# Patient Record
Sex: Male | Born: 1960 | Race: White | Hispanic: No | Marital: Single | Smoking: Never smoker
Health system: Southern US, Academic
[De-identification: ages and names within clinical notes are randomized; demographics above are authoritative.]

## PROBLEM LIST (undated history)

## (undated) DIAGNOSIS — I4891 Unspecified atrial fibrillation: Secondary | ICD-10-CM

## (undated) DIAGNOSIS — E785 Hyperlipidemia, unspecified: Secondary | ICD-10-CM

## (undated) DIAGNOSIS — R55 Syncope and collapse: Secondary | ICD-10-CM

## (undated) DIAGNOSIS — K219 Gastro-esophageal reflux disease without esophagitis: Secondary | ICD-10-CM

## (undated) DIAGNOSIS — G4733 Obstructive sleep apnea (adult) (pediatric): Secondary | ICD-10-CM

## (undated) DIAGNOSIS — C801 Malignant (primary) neoplasm, unspecified: Secondary | ICD-10-CM

## (undated) DIAGNOSIS — B019 Varicella without complication: Secondary | ICD-10-CM

## (undated) DIAGNOSIS — E7800 Pure hypercholesterolemia, unspecified: Secondary | ICD-10-CM

## (undated) DIAGNOSIS — C439 Malignant melanoma of skin, unspecified: Secondary | ICD-10-CM

## (undated) HISTORY — DX: Malignant melanoma of skin, unspecified (CMS HCC): C43.9

## (undated) HISTORY — DX: Gastro-esophageal reflux disease without esophagitis: K21.9

## (undated) HISTORY — DX: Unspecified atrial fibrillation (CMS HCC): I48.91

## (undated) HISTORY — DX: Malignant (primary) neoplasm, unspecified (CMS HCC): C80.1

## (undated) HISTORY — DX: Pure hypercholesterolemia, unspecified: E78.00

## (undated) HISTORY — PX: HX TONSILLECTOMY: SHX27

## (undated) HISTORY — PX: HX OTHER: 2100001105

## (undated) HISTORY — PX: HX WISDOM TEETH EXTRACTION: SHX21

## (undated) HISTORY — DX: Varicella without complication: B01.9

## (undated) HISTORY — PX: COLONOSCOPY: WVUENDOPRO10

## (undated) HISTORY — DX: Hyperlipidemia, unspecified: E78.5

## (undated) HISTORY — DX: Unspecified atrial fibrillation: I48.91

## (undated) HISTORY — DX: Obstructive sleep apnea (adult) (pediatric): G47.33

## (undated) HISTORY — DX: Syncope and collapse: R55

## (undated) HISTORY — PX: TONSILLECTOMY: SUR1361

---

## 1998-11-24 ENCOUNTER — Ambulatory Visit (HOSPITAL_COMMUNITY): Payer: Self-pay | Admitting: Internal Medicine

## 1999-04-03 ENCOUNTER — Emergency Department (HOSPITAL_COMMUNITY): Admission: EM | Admit: 1999-04-03 | Discharge: 1999-04-03 | Payer: Self-pay | Admitting: Emergency Medicine

## 1999-04-04 ENCOUNTER — Encounter: Payer: Self-pay | Admitting: Emergency Medicine

## 2000-07-26 ENCOUNTER — Encounter: Payer: Self-pay | Admitting: Gastroenterology

## 2000-07-26 ENCOUNTER — Ambulatory Visit (HOSPITAL_COMMUNITY): Admission: RE | Admit: 2000-07-26 | Discharge: 2000-07-26 | Payer: Self-pay | Admitting: Gastroenterology

## 2000-08-12 ENCOUNTER — Encounter (INDEPENDENT_AMBULATORY_CARE_PROVIDER_SITE_OTHER): Payer: Self-pay

## 2000-08-12 ENCOUNTER — Ambulatory Visit (HOSPITAL_COMMUNITY): Admission: RE | Admit: 2000-08-12 | Discharge: 2000-08-12 | Payer: Self-pay | Admitting: Gastroenterology

## 2000-11-05 ENCOUNTER — Emergency Department (HOSPITAL_COMMUNITY): Admission: EM | Admit: 2000-11-05 | Discharge: 2000-11-05 | Payer: Self-pay | Admitting: *Deleted

## 2003-05-02 ENCOUNTER — Inpatient Hospital Stay (HOSPITAL_COMMUNITY): Admission: AD | Admit: 2003-05-02 | Discharge: 2003-05-03 | Payer: Self-pay | Admitting: Cardiology

## 2003-08-17 ENCOUNTER — Encounter: Admission: RE | Admit: 2003-08-17 | Discharge: 2003-08-17 | Payer: Self-pay | Admitting: Family Medicine

## 2004-07-05 ENCOUNTER — Ambulatory Visit: Payer: Self-pay | Admitting: Cardiology

## 2005-07-31 ENCOUNTER — Ambulatory Visit: Payer: Self-pay | Admitting: Internal Medicine

## 2006-01-14 ENCOUNTER — Ambulatory Visit: Payer: Self-pay | Admitting: Cardiology

## 2007-02-10 ENCOUNTER — Ambulatory Visit: Payer: Self-pay | Admitting: Cardiology

## 2008-09-29 ENCOUNTER — Telehealth: Payer: Self-pay | Admitting: Cardiology

## 2008-11-25 ENCOUNTER — Telehealth: Payer: Self-pay | Admitting: Cardiology

## 2008-12-21 DIAGNOSIS — R55 Syncope and collapse: Secondary | ICD-10-CM | POA: Insufficient documentation

## 2008-12-22 DIAGNOSIS — K219 Gastro-esophageal reflux disease without esophagitis: Secondary | ICD-10-CM | POA: Insufficient documentation

## 2008-12-22 DIAGNOSIS — R7309 Other abnormal glucose: Secondary | ICD-10-CM | POA: Insufficient documentation

## 2008-12-22 DIAGNOSIS — I4891 Unspecified atrial fibrillation: Secondary | ICD-10-CM | POA: Insufficient documentation

## 2008-12-22 DIAGNOSIS — E785 Hyperlipidemia, unspecified: Secondary | ICD-10-CM | POA: Insufficient documentation

## 2009-10-08 ENCOUNTER — Encounter: Payer: Self-pay | Admitting: Cardiology

## 2009-10-08 ENCOUNTER — Emergency Department (HOSPITAL_COMMUNITY): Admission: EM | Admit: 2009-10-08 | Discharge: 2009-10-08 | Payer: Self-pay | Admitting: Emergency Medicine

## 2010-02-26 ENCOUNTER — Telehealth: Payer: Self-pay | Admitting: Cardiology

## 2010-03-06 ENCOUNTER — Encounter: Payer: Self-pay | Admitting: Cardiology

## 2010-03-08 ENCOUNTER — Ambulatory Visit
Admission: RE | Admit: 2010-03-08 | Discharge: 2010-03-08 | Payer: Self-pay | Source: Home / Self Care | Admitting: Orthopedic Surgery

## 2010-03-20 ENCOUNTER — Ambulatory Visit: Payer: Self-pay | Admitting: Cardiology

## 2010-03-30 ENCOUNTER — Ambulatory Visit: Payer: Self-pay | Admitting: Cardiology

## 2010-07-19 NOTE — Assessment & Plan Note (Signed)
Summary: rov/jss   History of Present Illness: [name] is a  gentleman, who has a history of paroxysmal atrial fibrillation.  He has not been seen since August of 2008. Note he had a prolonged vagal episode following an exercise treadmill requiring transient CPR. Since he was last seen he denies dyspnea on exertion, orthopnea, PND, pedal edema, palpitations or syncope. He has had no chest pain. He's had no bouts of atrial fibrillation but felt like on 2 occasions he may be close to going into this rhythm.  Current Medications (verified): 1)  Lipitor 40 Mg Tabs (Atorvastatin Calcium) .Marland Kitchen.. 1 Tab By Mouth Once Daily 2)  Metoprolol Succinate 25 Mg Xr24h-Tab (Metoprolol Succinate) .Marland Kitchen.. 1 Tab By Mouth Once Daily 3)  Aspirin 81 Mg Tbec (Aspirin) .... Take One Tablet By Mouth Daily 4)  Prevacid 30 Mg Cpdr (Lansoprazole) .Marland Kitchen.. 1 Tab By Mouth Once Daily 5)  Multivitamins   Tabs (Multiple Vitamin) .Marland Kitchen.. 1 Tab By Mouth Once Daily  Allergies: 1)  ! Pcn 2)  ! * Clindamycin  Past History:  Past Medical History: HYPERLIPIDEMIA (ICD-272.4) PAROXYSMAL ATRIAL FIBRILLATION (ICD-427.31) SYNCOPE AND COLLAPSE (ICD-780.2) GERD (ICD-530.81) vagal episode  Past Surgical History: Reviewed history from 12/21/2008 and no changes required. tonsillectomy.  Family History: Reviewed history from 12/21/2008 and no changes required. Family History of Coronary Artery Disease (father with MI at age 66)  Social History: Reviewed history from 12/21/2008 and no changes required.  He does not smoke.  He does occasionally consume alcohol,  but has decreased this since his atrial fibrillation began. Full Time Single   Review of Systems       no fevers or chills, productive cough, hemoptysis, dysphasia, odynophagia, melena, hematochezia, dysuria, hematuria, rash, seizure activity, orthopnea, PND, pedal edema, claudication. Remaining systems are negative.   Vital Signs:  Patient profile:   50 year old  male Height:      70 inches Weight:      201 pounds BMI:     28.94 Pulse rate:   70 / minute Resp:     12 per minute BP sitting:   118 / 70  (left arm)  Vitals Entered By: Kem Parkinson (March 20, 2010 3:38 PM)  Physical Exam  General:  Well developed/well nourished in NAD Skin warm/dry Patient not depressed No peripheral clubbing Back-normal HEENT-normal/normal eyelids Neck supple/normal carotid upstroke bilaterally; no bruits; no JVD; no thyromegaly chest - CTA/ normal expansion CV - RRR/normal S1 and S2; no murmurs, rubs or gallops;  PMI nondisplaced Abdomen -NT/ND, no HSM, no mass, + bowel sounds, no bruit 2+ femoral pulses, no bruits Ext-no edema, chords, 2+ DP Neuro-grossly nonfocal     EKG  Procedure date:  03/06/2010  Findings:      Sinus rhythm at a rate of 53. Left ventricle hypertrophy. No ST changes.  Impression & Recommendations:  Problem # 1:  PAROXYSMAL ATRIAL FIBRILLATION (ICD-427.31)  No recurrent episodes off of flecanide. Continue beta blocker. Continue aspirin. No embolic risk factors. The following medications were removed from the medication list:    Flecainide Acetate 50 Mg Tabs (Flecainide acetate) His updated medication list for this problem includes:    Metoprolol Succinate 25 Mg Xr24h-tab (Metoprolol succinate) .Marland Kitchen... 1 tab by mouth once daily    Aspirin 81 Mg Tbec (Aspirin) .Marland Kitchen... Take one tablet by mouth daily  The following medications were removed from the medication list:    Flecainide Acetate 50 Mg Tabs (Flecainide acetate) His updated medication list for this  problem includes:    Metoprolol Succinate 25 Mg Xr24h-tab (Metoprolol succinate) .Marland Kitchen... 1 tab by mouth once daily    Aspirin 81 Mg Tbec (Aspirin) .Marland Kitchen... Take one tablet by mouth daily  Problem # 2:  HYPERLIPIDEMIA (ICD-272.4)  Continue statin. Check lipids and liver. I have explained the importance of close followup. The following medications were removed from the  medication list:    Lipitor 40 Mg Tabs (Atorvastatin calcium) His updated medication list for this problem includes:    Lipitor 40 Mg Tabs (Atorvastatin calcium) .Marland Kitchen... 1 tab by mouth once daily  The following medications were removed from the medication list:    Lipitor 40 Mg Tabs (Atorvastatin calcium) His updated medication list for this problem includes:    Lipitor 40 Mg Tabs (Atorvastatin calcium) .Marland Kitchen... 1 tab by mouth once daily  Problem # 3:  SYNCOPE AND COLLAPSE (ICD-780.2) Occurred previously following his treadmill and felt to be vagal in etiology. The following medications were removed from the medication list:    Flecainide Acetate 50 Mg Tabs (Flecainide acetate) His updated medication list for this problem includes:    Metoprolol Succinate 25 Mg Xr24h-tab (Metoprolol succinate) .Marland Kitchen... 1 tab by mouth once daily    Aspirin 81 Mg Tbec (Aspirin) .Marland Kitchen... Take one tablet by mouth daily  Problem # 4:  GERD (ICD-530.81)  His updated medication list for this problem includes:    Prevacid 30 Mg Cpdr (Lansoprazole) .Marland Kitchen... 1 tab by mouth once daily  His updated medication list for this problem includes:    Prevacid 30 Mg Cpdr (Lansoprazole) .Marland Kitchen... 1 tab by mouth once daily  Patient Instructions: 1)  Your physician recommends that you schedule a follow-up appointment in: YEAR WITH DR  CRENSHAW 2)  Your physician recommends that you return for lab work ZO:XWRUE LIVER FASTING 03/22/10 AT 8:30 3)  Your physician recommends that you continue on your current medications as directed. Please refer to the Current Medication list given to you today. Prescriptions: LIPITOR 40 MG TABS (ATORVASTATIN CALCIUM) 1 tab by mouth once daily  #90 x 3   Entered by:   Scherrie Bateman, LPN   Authorized by:   Ferman Hamming, MD, [facility]   Signed by:   Scherrie Bateman, LPN on 45/40/9811   Method used:   [facility]  [address] [id number]       [address]       [place], [place]  [address]       Ph: [phone] or [phone]       Fax: [phone]   RxID:   [id number] METOPROLOL SUCCINATE 25 MG XR24H-TAB (METOPROLOL SUCCINATE) 1 tab by mouth once daily  #90 x 3   Entered by:   Scherrie Bateman, LPN   Authorized by:   Ferman Hamming, MD, [facility]   Signed by:   Scherrie Bateman, LPN on 38/75/6433   Method used:   [facility]  [address] [id number]       [address]       [place], [place]  [address]       Ph: [phone] or [phone]       Fax: [phone]   RxID:   [id number]

## 2010-07-19 NOTE — Letter (Signed)
Summary: [facility] Physician Documentation Sheet  [facility] Physician Documentation Sheet   Imported By: Earl Many 03/16/2010 15:24:00  _____________________________________________________________________  External Attachment:    Type:   Image     Comment:   External Document

## 2010-07-19 NOTE — Progress Notes (Signed)
Summary: refill  Phone Note Refill Request Message from:  Patient on February 26, 2010 1:06 PM  Refills Requested: Medication #1:  METOPROLOL SUCCINATE 25 MG XR24H-TAB 1 tab by mouth once [id number]  Initial call taken by: Judie Grieve,  February 26, 2010 1:07 PM  Follow-up for Phone Call        Per Dr. Jens Som  can only give enough to last till next appt...will not be able to give pt more if he does not show up.. Called pt let him no he must come to next appt Follow-up by: Kem Parkinson,  February 26, 2010 1:42 PM    Prescriptions: METOPROLOL SUCCINATE 25 MG XR24H-TAB (METOPROLOL SUCCINATE) 1 tab by mouth once daily  #30 Tablet x 1   Entered by:   Kem Parkinson   Authorized by:   Ferman Hamming, MD, [facility]   Signed by:   Kem Parkinson on 02/26/2010   Method used:   [facility]  [address] [id number]       [address]       [place], [place]  [address]       Ph: [phone] or [phone]       Fax: [phone]   RxID:   [id number]

## 2010-08-30 LAB — POCT I-STAT, CHEM 8
BUN: 14 mg/dL (ref 6–23)
Calcium, Ion: 1.2 mmol/L (ref 1.12–1.32)
Chloride: 106 mEq/L (ref 96–112)
HCT: 42 % (ref 39.0–52.0)
Potassium: 3.7 mEq/L (ref 3.5–5.1)
Sodium: 140 mEq/L (ref 135–145)

## 2010-08-30 LAB — POCT HEMOGLOBIN-HEMACUE: Hemoglobin: 14.7 g/dL (ref 13.0–17.0)

## 2010-10-30 NOTE — Assessment & Plan Note (Signed)
[facility]                            CARDIOLOGY OFFICE NOTE   NAME:WOOFTERElster, Corbello                       MRN:          [id number]  DATE:02/10/2007                            DOB:          Nov 03, 1960    Mr. Catala is a 50 year old gentleman, who has a history of paroxysmal  atrial fibrillation and is on flecainide therapy.  Since I last saw him,  he denies any dyspnea on exertion, orthopnea, PND, pedal edema,  palpitations, pre-syncope, syncope or exertional chest pain.  He has had  back pain from a recent injury.   MEDICATIONS:  His medications include:  1. Aspirin 81 mg p.o. q. day.  2. Toprol 25 mg p.o. b.i.d.  3. Prevacid 30 mg p.o. q. day.  4. Lipitor 40 mg p.o. q. day.  5. Flecainide 50 mg, and he has only taking this once a day.   PHYSICAL EXAMINATION:  VITAL SIGNS:  Today, shows a blood pressure of  115/74, and his pulse is 55.  He weighs 197 pounds.  HEENT:  Normal.  NECK:  Supple.  CHEST:  Clear.  CARDIOVASCULAR:  Regular rate and rhythm.  ABDOMEN:  Benign.  EXTREMITIES:  Show no edema.   Electrocardiogram shows a sinus rhythm at a rate of 53.  There are no ST  changes noted.   DIAGNOSES:  1. Paroxysmal atrial fibrillation - the patient has had no      palpitations and is in sinus rhythm today.  He has only taken his      flecainide once daily.  Most of his previous episodes occurred      following episodes of significant alcohol intake.  He does not do      this anymore.  We will therefore discontinue his flecainide and      follow him expectantly.  If he develops recurrent arrhythmias in      the future, then we could resume.  I will continue on his Toprol,      as well as his aspirin (he has no embolic risk factors).  2. Hyperlipidemia - He is on Lipitor, and we will make sure that he      has had recent lipids and liver drawn.  3. History of vagal episode, after termination of exercise treadmill.   I will see him back in  12 months.     Madolyn Frieze Jens Som, MD, [facility]  Electronically Signed   BSC/MedQ  DD: 02/10/2007  DT: 02/10/2007  Job #: [id number]

## 2010-11-02 NOTE — H&P (Signed)
NAME:  Colton Moore, BARIS NO.:  [id number]   MEDICAL RECORD NO.:  [id number]                   PATIENT TYPE:  INP   LOCATION:                                       FACILITY:  [facility]   PHYSICIAN:  Olga Millers, M.D.                DATE OF BIRTH:  1961-04-07   DATE OF ADMISSION:  05/02/2003  DATE OF DISCHARGE:                                HISTORY & PHYSICAL   The patient is a 50 year old gentleman with a past medical history of  paroxysmal atrial fibrillation and hyperlipidemia who was admitted with a  syncopal episode.  The patient has a long history of paroxysmal atrial  fibrillation.  He did have stress test performed in June of 2004 which  showed an ejection fraction of 50%, but no ischemia or infarction.  His most  recent echocardiogram was in July of 2004 and showed normal LV function,  mild right atrial enlargement, trace aortic insufficiency, and trace mitral  regurgitation and tricuspid regurgitation.  The patient has had recent  episodes of atrial fibrillation that have been symptomatic.  I saw him on  Friday of last week on November 11.  He had developed palpitations the night  before and was in atrial fibrillation at the time of evaluation.  His rate  was controlled.  He does complain of fatigue with these episodes but there  was no shortness of breath, chest pain, or syncope.  Because of the  symptomatic nature of his atrial fibrillation we placed the patient on  flecainide 50 mg p.o. b.i.d.  He states that he converted Saturday morning.  He otherwise has not had syncope, chest pain, or shortness of breath.  He  presented to the office today to have his treadmill to exclude ventricular  arrhythmias while exercising.  He did well on the treadmill and exercised  for 12 minutes and 15 seconds.  His heart rate at rest was 45-52.  It  increased to a maximum of 151.  There were no ST changes and there was a  rare PVC noted.  However, after  completing exercise the patient became  suddenly dizzy and his heart rate decreased and subsequently went asystolic.  He did receive chest compressions for approximately two seconds while he was  in the treadmill room.  He then had return of his rhythm.  He was  diaphoretic but he denied any chest pain, shortness of breath, or other  complaints.  We felt this was most likely a vagal reaction.  He is being  admitted for observation.   CURRENT MEDICATIONS:  1. Aspirin 81 mg p.o. daily.  2. Zocor 40 mg p.o. q.h.s.  3. Toprol 25 mg p.o. daily.  4. Flecainide 50 mg p.o. b.i.d.   ALLERGIES:  PENICILLIN, CLINDAMYCIN.   SOCIAL HISTORY:  He does not smoke.  He does occasionally consume alcohol,  but has decreased this since his  atrial fibrillation began.   FAMILY HISTORY:  Positive for coronary artery disease.   PAST MEDICAL HISTORY:  Significant for hyperlipidemia but there is no  diabetes mellitus or hypertension.  He does have a history of paroxysmal  atrial fibrillation and gastroesophageal reflux disease.   PAST SURGICAL HISTORY:  He has had a prior tonsillectomy.   REVIEW OF SYSTEMS:  There is no headaches, fevers, or chills.  There is no  productive cough or hemoptysis.  There is no dysphagia, odynophagia, melena,  or hematochezia.  There is no dysuria or hematuria.  There is no rash or  seizure activity.  There is no orthopnea, PND, or pedal edema.  He has had  no prior syncopal episodes.  Remaining systems are negative.   PHYSICAL EXAMINATION:  VITAL SIGNS:  Blood pressure 112/57, pulse now is 80.  GENERAL:  He is well-developed and well-nourished.  He is mildly  diaphoretic.  SKIN:  Otherwise unremarkable.  He does not appear to be depressed and there  is no peripheral clubbing.  HEENT:  Unremarkable with normal eyelids.  NECK:  Supple with a normal upstroke bilaterally.  There are no bruits  noted.  There is no jugular venous distention or thyromegaly noted.  CHEST:  Clear  to auscultation.  Normal expansion.  CARDIOVASCULAR:  Regular rate and rhythm with normal S1, S2.  There are no  murmurs, rubs, or gallops noted.  ABDOMEN:  No tenderness seen.  Positive bowel sounds.  No  hepatosplenomegaly.  No mass appreciated.  There is no abdominal bruit.  He  has 2+ femoral pulses bilaterally and no bruits.  EXTREMITIES:  No edema.  I can palpate no cords.  He has 2+ dorsalis pedis  pulses bilaterally.  NEUROLOGIC:  Grossly intact.   LABORATORIES:  His baseline echocardiogram showed a sinus bradycardia.  There were no ST changes and his intervals were normal.   DIAGNOSES:  1. Syncopal episode most likely vagal in etiology following exercise     treadmill test.  2. Paroxysmal atrial fibrillation.  3. Flecainide therapy.  4. Hyperlipidemia.  5. Gastroesophageal reflux disease.   PLAN:  The patient presented for his exercise treadmill today to exclude  ventricular arrhythmias following initiation of his flecainide.  His  exercise treadmill went very well with normal heart rate response as well as  blood pressure response.  There was also no chest pain or ST changes.  Following termination he appeared to vagal with decreased heart rate leading  to eventual asystole.  He did receive compressions for one to two seconds.  He is now asymptomatic.  I have elected to admit him to telemetry for  observation.  We will continue with his present medications.  I did review  the strips with Doylene Canning. Ladona Ridgel, M.D. and he agreed that this is most likely  a vagal response and no other work-up is necessary as long as his enzymes  are negative and he has no further arrhythmias.  We will continue with his  Toprol and flecainide at this point.  He does not have embolic risk factors  and we will therefore continue aspirin and no Coumadin.                                                Olga Millers, M.D.   BC/MEDQ  D:  05/02/2003  T:  05/02/2003  Job:  [id number]

## 2010-11-02 NOTE — Discharge Summary (Signed)
NAME:  Colton Moore, Colton Moore                          ACCOUNT NO.:  [id number]   MEDICAL RECORD NO.:  [id number]                   PATIENT TYPE:  INP   LOCATION:  4736                                 FACILITY:  [facility]   PHYSICIAN:  Olga Millers, M.D.                DATE OF BIRTH:  06-15-1961   DATE OF ADMISSION:  05/02/2003  DATE OF DISCHARGE:  05/03/2003                                 DISCHARGE SUMMARY   DISCHARGE DIAGNOSES:  1. Severe vagal episode following ETT.  2. Cardiac enzymes this admission x2 are negative.  3. No further rhythm disturbance this hospitalization.  4. The patient had ventricular asystole requiring chest compressions     following stress exercise study at [facility] office May 02, 2003.  No ventricular arrhythmias during the study itself.   SECONDARY DIAGNOSES:  1. History of paroxysmal atrial fibrillation, recently started on     flecainide.  2. Gastroesophageal reflux disease.  3. Dyslipidemia.  4. Mild hyperglycemia on laboratory studies this admission.   No procedures this hospitalization.   BRIEF HISTORY:  Colton Moore is a 50 year old male with a history of  paroxysmal atrial fibrillation, recently placed on flecainide who presented  in the office May 02, 2003, for exercise treadmill study to rule out  ventricular arrhythmias.  The patient exercised over 12 minutes on standard  Bruce protocol, achieving a heart rate of 150 beats per minute.  There were  no ventricular arrhythmias during the study.  The patient did not have any  chest pain.  He was somewhat fatigued, but this test was studied when target  heart rate was obtained.  Within the first two minutes of recovery he  developed bradycardia with heart rate in the 40s and then ventricular  asystole for several seconds.  A code was called, and chest compressions  were begun.  Heart rate was quickly restored to normal sinus rhythm.  The  patient does not remember any of the  resuscitation efforts.  The patient was  transferred to [facility] on telemetry for observation and cardiac  enzymes.  Cardiac enzymes x2 were negative.  No further arrhythmias noted on  telemetry while at [facility].   PLAN:  The patient's discharge is May 03, 2003.  Maintains medications  prior to admission.  He will have follow-up with Dr. Olga Millers.  Also  to follow up with Dr. Susann Givens regarding possible glucose intolerance.  He  goes home on the following medications:  1. Flecainide 50 mg twice daily.  2. Enteric-coated aspirin 81 mg daily.  3. Toprol-XL 25 mg daily.  4. Zocor 40 mg daily at bedtime.  5. Prevacid 30 mg daily.   PAIN MANAGEMENT:  Not applicable.   ACTIVITY:  No restrictions to activity.   DIET:  Low-sodium, low-cholesterol diet.   WOUND CARE:  Not applicable.   He has an office visit  with Dr. Jens Som on Tuesday, May 17, 2003, at  4:15 in the afternoon.  He is to check in with Dr. Susann Givens regarding  possible glucose intolerance.   Once again, the hospital course is unremarkable.  No further rhythm  disturbance.  No chest pain.  Cardiac enzymes negative.  The patient goes  home with the medications and follow-up as dictated.   LABORATORY STUDIES:  Cardiac enzymes May 02, 2003, at 1615 hours:  CK  87, CK-MB 1.2, troponin I less than 0.01.  Enzymes on May 02, 2003,  2330 hours:  CK 83, CK-MB 1, troponin I 0.02.  Serum electrolytes on the  morning of May 03, 2003:  Sodium 138, potassium 4.2, chloride 106,  bicarbonate 26, BUN 16, creatinine 1, glucose 117.  Alkaline phosphatase is  76, SGOT 25, SGPT 33.  Complete blood count:  Hemoglobin 15.2, hematocrit  44, white count 9.9, platelets 245.   He will also follow up at the time of seeing Dr. Jens Som with a cholesterol  study which was taken in the office November 15 and liver function tests  also taken May 02, 2003.  However, liver function tests done today  at  [facility] were within normal limits.      Maple Mirza, P.A.                    Olga Millers, M.D.    GM/MEDQ  D:  05/03/2003  T:  05/03/2003  Job:  [id number]   cc:   Sharlot Gowda, M.D.  [address] [place], [place] [address]  Fax: [phone]

## 2010-11-02 NOTE — Assessment & Plan Note (Signed)
[facility]                              CARDIOLOGY OFFICE NOTE   NAME:[name], [name]                       MRN:          [id number]  DATE:01/14/2006                            DOB:          03/31/1961    [name] is a very pleasant gentleman who has a history of paroxysmal  atrial fibrillation and is on flecainide.  Since I last saw him he is doing  well with no dyspnea, chest pain, palpitations or syncope.   CURRENT MEDICATIONS:  1.  Aspirin 81 mg p.o. daily.  2.  Toprol 25 mg two daily.  3.  Flecainide 50 mg by mouth b.i.d.  4.  Prevacid.   PHYSICAL EXAMINATION:  VITAL SIGNS:  Blood pressure 130/88 and his pulse is  58.  NECK:  Supple.  CHEST:  Clear.  CARDIOVASCULAR:  Regular rate and rhythm.  EXTREMITIES:  No edema.   ELECTROCARDIOGRAMS:  Shows a sinus rhythm at a rate of 58.  There are no  significant ST-changes noted.   DIAGNOSIS:  1.  History of paroxysmal atrial fibrillation.  2.  Flecainide therapy.  3.  History of hyperlipidemia.  4.  History of vagal episode after termination on exercise treadmill.   PLAN:  1.  [name] is doing well from a symptomatic standpoint.  He remains in      sinus rhythm.  2.  We will continue with his flecainide and Toprol.  3.  He has no embolic risk factors and therefore will continue on aspirin.      We could consider discontinuing his flecainide as a trial in the future      to see if his atrial fibrillation occurs.  However, we will make no      changes at present.  4.  He has discontinued his Lipitor and we will have him return for fasting      lipids to see how his      cholesterol is doing off of therapy.  5.  I will see him back in twelve months.                              Madolyn Frieze Colton Som, MD, [facility]   BSC/MedQ  DD:  01/14/2006  DT:  01/14/2006  Job #:  [id number]

## 2011-03-14 ENCOUNTER — Encounter: Payer: Self-pay | Admitting: *Deleted

## 2011-03-14 ENCOUNTER — Encounter: Payer: Self-pay | Admitting: Cardiology

## 2011-03-18 ENCOUNTER — Ambulatory Visit: Payer: Self-pay | Admitting: Cardiology

## 2011-05-02 ENCOUNTER — Other Ambulatory Visit: Payer: Self-pay

## 2011-05-02 MED ORDER — METOPROLOL SUCCINATE ER 25 MG PO TB24: 25.0000 mg | ORAL_TABLET | Freq: Every day | ORAL | Status: DC

## 2011-05-15 ENCOUNTER — Ambulatory Visit: Payer: Self-pay | Admitting: Cardiology

## 2011-05-24 ENCOUNTER — Telehealth: Payer: Self-pay | Admitting: Cardiology

## 2011-05-24 MED ORDER — ATORVASTATIN CALCIUM 40 MG PO TABS: 40.0000 mg | ORAL_TABLET | Freq: Every day | ORAL | Status: DC

## 2011-05-24 NOTE — Telephone Encounter (Signed)
Pt needs refill of Lipitor called into [facility] [address]

## 2011-06-21 ENCOUNTER — Ambulatory Visit: Payer: Self-pay | Admitting: Cardiology

## 2011-08-20 ENCOUNTER — Ambulatory Visit: Payer: Self-pay | Admitting: Cardiology

## 2011-09-09 ENCOUNTER — Ambulatory Visit (INDEPENDENT_AMBULATORY_CARE_PROVIDER_SITE_OTHER): Payer: Self-pay | Admitting: Cardiology

## 2011-09-09 ENCOUNTER — Encounter: Payer: Self-pay | Admitting: Cardiology

## 2011-09-09 VITALS — BP 127/78 | HR 55 | Ht 71.0 in | Wt 217.0 lb

## 2011-09-09 DIAGNOSIS — I4891 Unspecified atrial fibrillation: Secondary | ICD-10-CM

## 2011-09-09 DIAGNOSIS — E7800 Pure hypercholesterolemia, unspecified: Secondary | ICD-10-CM

## 2011-09-09 DIAGNOSIS — E785 Hyperlipidemia, unspecified: Secondary | ICD-10-CM

## 2011-09-09 MED ORDER — METOPROLOL SUCCINATE ER 25 MG PO TB24: 25.0000 mg | ORAL_TABLET | Freq: Every day | ORAL | Status: DC

## 2011-09-09 MED ORDER — ATORVASTATIN CALCIUM 40 MG PO TABS: 40.0000 mg | ORAL_TABLET | Freq: Every day | ORAL | Status: DC

## 2011-09-09 NOTE — Progress Notes (Signed)
   HPI: Colton Moore is a  gentleman, who has a history of paroxysmal atrial fibrillation. Note he had a prolonged vagal episode following an exercise treadmill requiring transient CPR. I last saw him in October of 2011. Since then, the patient denies any dyspnea on exertion, orthopnea, PND, pedal edema, syncope or chest pain. Patient has had 2 brief episodes of atrial fibrillation in the past 8 months. They are described as palpitations without associated symptoms. They resolve after one to 2 hours spontaneously.   Current Outpatient Prescriptions  Medication Sig Dispense Refill  . aspirin 81 MG tablet Take 81 mg by mouth daily.      Marland Kitchen atorvastatin (LIPITOR) 40 MG tablet Take 1 tablet (40 mg total) by mouth daily.  30 tablet  12  . metoprolol succinate (TOPROL-XL) 25 MG 24 hr tablet Take 1 tablet (25 mg total) by mouth daily.  90 tablet  3     Past Medical History  Diagnosis Date  . Syncope and collapse   . PAROXYSMAL ATRIAL FIBRILLATION   . HYPERLIPIDEMIA   . GERD     Past Surgical History  Procedure Date  . Tonsillectomy     History   Social History  . Marital Status: Single    Spouse Name: N/A    Number of Children: N/A  . Years of Education: N/A   Occupational History  . Not on file.   Social History Main Topics  . Smoking status: Never Smoker   . Smokeless tobacco: Not on file  . Alcohol Use: Not on file  . Drug Use: Not on file  . Sexually Active: Not on file   Other Topics Concern  . Not on file   Social History Narrative  . No narrative on file    ROS: no fevers or chills, productive cough, hemoptysis, dysphasia, odynophagia, melena, hematochezia, dysuria, hematuria, rash, seizure activity, orthopnea, PND, pedal edema, claudication. Remaining systems are negative.  Physical Exam: Well-developed well-nourished in no acute distress.  Skin is warm and dry.  HEENT is normal.  Neck is supple. No thyromegaly.  Chest is clear to auscultation with normal  expansion.  Cardiovascular exam is regular rate and rhythm.  Abdominal exam nontender or distended. No masses palpated. Extremities show no edema. neuro grossly intact  ECG sinus bradycardia at a rate of 55. No ST changes.

## 2011-09-09 NOTE — Assessment & Plan Note (Signed)
Continue statin. Check lipids and liver. 

## 2011-09-09 NOTE — Patient Instructions (Signed)
Your physician wants you to follow-up in: ONE YEAR You will receive a reminder letter in the mail two months in advance. If you don't receive a letter, please call our office to schedule the follow-up appointment.   Your physician recommends that you return for lab work in: WHEN FASTING  

## 2011-09-09 NOTE — Assessment & Plan Note (Signed)
The patient remains in sinus rhythm. He had 2 brief episodes of atrial fibrillation in the past 8 months. If they become more frequent we could consider an antiarrhythmic in the future. No embolic risk factors. No indication for Coumadin.

## 2011-09-20 ENCOUNTER — Other Ambulatory Visit: Payer: Self-pay

## 2012-08-16 ENCOUNTER — Other Ambulatory Visit: Payer: Self-pay | Admitting: Cardiology

## 2013-06-28 ENCOUNTER — Ambulatory Visit: Payer: Self-pay | Admitting: Cardiology

## 2013-07-05 ENCOUNTER — Encounter: Payer: Self-pay | Admitting: *Deleted

## 2013-07-05 ENCOUNTER — Ambulatory Visit (INDEPENDENT_AMBULATORY_CARE_PROVIDER_SITE_OTHER): Payer: BC Managed Care – PPO | Admitting: Cardiology

## 2013-07-05 ENCOUNTER — Encounter: Payer: Self-pay | Admitting: Cardiology

## 2013-07-05 VITALS — BP 120/68 | HR 62 | Ht 71.0 in | Wt 217.0 lb

## 2013-07-05 DIAGNOSIS — I4891 Unspecified atrial fibrillation: Secondary | ICD-10-CM

## 2013-07-05 NOTE — Patient Instructions (Signed)
Your physician wants you to follow-up in: ONE YEAR WITH DR Shelda PalRENSHAW You will receive a reminder letter in the mail two months in advance. If you don't receive a letter, please call our office to schedule the follow-up appointment.   Your physician has requested that you have an echocardiogram. Echocardiography is a painless test that uses sound waves to create images of your heart. It provides your doctor with information about the size and shape of your heart and how well your heart's chambers and valves are working. This procedure takes approximately one hour. There are no restrictions for this procedure.   Your physician recommends that you return for lab work FASTING WITH ECHO

## 2013-07-05 NOTE — Progress Notes (Signed)
      HPI: [name] is a gentleman, who has a history of paroxysmal atrial fibrillation. Note he had a prolonged vagal episode following an exercise treadmill requiring transient CPR. I last saw him in March 2013. Since then, the patient denies any dyspnea on exertion, orthopnea, PND, pedal edema, palpitations, syncope or chest pain.   Current Outpatient Prescriptions  Medication Sig Dispense Refill  . aspirin 81 MG tablet Take 81 mg by mouth daily.      Marland Kitchen. atorvastatin (LIPITOR) 40 MG tablet Take 1 tablet (40 mg total) by mouth daily.  90 tablet  4  . metoprolol succinate (TOPROL-XL) 25 MG 24 hr tablet TAKE 1 TABLET (25 MG TOTAL) BY MOUTH DAILY.  90 tablet  2   No current facility-administered medications for this visit.     Past Medical History  Diagnosis Date  . Syncope and collapse   . PAROXYSMAL ATRIAL FIBRILLATION   . HYPERLIPIDEMIA   . GERD     Past Surgical History  Procedure Laterality Date  . Tonsillectomy      History   Social History  . Marital Status: Single    Spouse Name: N/A    Number of Children: N/A  . Years of Education: N/A   Occupational History  . Not on file.   Social History Main Topics  . Smoking status: Never Smoker   . Smokeless tobacco: Not on file  . Alcohol Use: Not on file  . Drug Use: Not on file  . Sexual Activity: Not on file   Other Topics Concern  . Not on file   Social History Narrative  . No narrative on file    ROS: no fevers or chills, productive cough, hemoptysis, dysphasia, odynophagia, melena, hematochezia, dysuria, hematuria, rash, seizure activity, orthopnea, PND, pedal edema, claudication. Remaining systems are negative.  Physical Exam: Well-developed well-nourished in no acute distress.  Skin is warm and dry.  HEENT is normal.  Neck is supple.  Chest is clear to auscultation with normal expansion.  Cardiovascular exam is regular rate and rhythm.  Abdominal exam nontender or distended. No masses  palpated. Extremities show no edema. neuro grossly intact  ECG sinus rhythm at a rate of 62. Left axis deviation. No ST changes.

## 2013-07-05 NOTE — Assessment & Plan Note (Signed)
Continue statin. Check lipids and liver. 

## 2013-07-05 NOTE — Assessment & Plan Note (Signed)
No recent episodes. Continue aspirin. No embolic risk factors. Continue Toprol. Repeat echocardiogram. We will consider antiarrhythmic in the future if his symptoms worsen.

## 2013-08-02 ENCOUNTER — Other Ambulatory Visit (HOSPITAL_COMMUNITY): Payer: BC Managed Care – PPO

## 2013-08-02 ENCOUNTER — Other Ambulatory Visit: Payer: BC Managed Care – PPO

## 2013-08-10 ENCOUNTER — Other Ambulatory Visit (HOSPITAL_COMMUNITY): Payer: BC Managed Care – PPO

## 2013-08-10 ENCOUNTER — Other Ambulatory Visit: Payer: BC Managed Care – PPO

## 2013-08-13 ENCOUNTER — Other Ambulatory Visit (HOSPITAL_COMMUNITY): Payer: BC Managed Care – PPO

## 2013-08-17 ENCOUNTER — Ambulatory Visit (HOSPITAL_COMMUNITY): Payer: BC Managed Care – PPO | Attending: Cardiology | Admitting: Radiology

## 2013-08-17 ENCOUNTER — Other Ambulatory Visit (INDEPENDENT_AMBULATORY_CARE_PROVIDER_SITE_OTHER): Payer: BC Managed Care – PPO

## 2013-08-17 DIAGNOSIS — I4891 Unspecified atrial fibrillation: Secondary | ICD-10-CM

## 2013-08-17 LAB — LIPID PANEL
CHOL/HDL RATIO: 3
Cholesterol: 178 mg/dL (ref 0–200)
HDL: 51.9 mg/dL (ref >39.00)
LDL CALC: 107 mg/dL — AB (ref 0–99)
Triglycerides: 94 mg/dL (ref 0.0–149.0)
VLDL: 18.8 mg/dL (ref 0.0–40.0)

## 2013-08-17 LAB — HEPATIC FUNCTION PANEL
ALBUMIN: 3.6 g/dL (ref 3.5–5.2)
ALK PHOS: 72 U/L (ref 39–117)
ALT: 27 U/L (ref 0–53)
AST: 19 U/L (ref 0–37)
BILIRUBIN DIRECT: 0.1 mg/dL (ref 0.0–0.3)
Total Bilirubin: 1 mg/dL (ref 0.3–1.2)
Total Protein: 6.4 g/dL (ref 6.0–8.3)

## 2013-08-17 NOTE — Progress Notes (Signed)
Echocardiogram Performed. 

## 2013-08-23 ENCOUNTER — Telehealth: Payer: Self-pay

## 2013-08-23 NOTE — Telephone Encounter (Signed)
Patient was out of town [place] called in to pharmacy in [place]. Done

## 2013-08-31 ENCOUNTER — Other Ambulatory Visit: Payer: Self-pay | Admitting: *Deleted

## 2013-08-31 MED ORDER — METOPROLOL SUCCINATE ER 25 MG PO TB24: ORAL_TABLET | ORAL | Status: DC

## 2013-11-16 ENCOUNTER — Other Ambulatory Visit: Payer: Self-pay | Admitting: *Deleted

## 2013-11-16 MED ORDER — ATORVASTATIN CALCIUM 40 MG PO TABS: 40.0000 mg | ORAL_TABLET | Freq: Every day | ORAL | Status: DC

## 2013-11-16 MED ORDER — METOPROLOL SUCCINATE ER 25 MG PO TB24: ORAL_TABLET | ORAL | Status: DC

## 2014-06-22 ENCOUNTER — Encounter: Payer: Self-pay | Admitting: Cardiology

## 2014-08-03 ENCOUNTER — Ambulatory Visit: Payer: Self-pay | Admitting: Cardiology

## 2014-12-27 NOTE — Progress Notes (Signed)
      HPI: [name] is a gentleman, who has a history of paroxysmal atrial fibrillation. Note he had a prolonged vagal episode following an exercise treadmill requiring transient CPR. Echo 3/15 shows normal LV function, mild AI, mild MR, mild RVE, mild TR. Since I last saw him, he denies dyspnea, chest pain, palpitations or syncope.  Current Outpatient Prescriptions  Medication Sig Dispense Refill  . aspirin 81 MG tablet Take 81 mg by mouth daily.    Marland Kitchen. atorvastatin (LIPITOR) 40 MG tablet Take 1 tablet (40 mg total) by mouth daily. 90 tablet 1  . metoprolol succinate (TOPROL-XL) 25 MG 24 hr tablet TAKE 1 TABLET (25 MG TOTAL) BY MOUTH DAILY. 90 tablet 1   No current facility-administered medications for this visit.     Past Medical History  Diagnosis Date  . Syncope and collapse   . PAROXYSMAL ATRIAL FIBRILLATION   . HYPERLIPIDEMIA   . GERD     Past Surgical History  Procedure Laterality Date  . Tonsillectomy      History   Social History  . Marital Status: Single    Spouse Name: N/A  . Number of Children: N/A  . Years of Education: N/A   Occupational History  . Not on file.   Social History Main Topics  . Smoking status: Never Smoker   . Smokeless tobacco: Not on file  . Alcohol Use: No  . Drug Use: No  . Sexual Activity: Not on file   Other Topics Concern  . Not on file   Social History Narrative    ROS: no fevers or chills, productive cough, hemoptysis, dysphasia, odynophagia, melena, hematochezia, dysuria, hematuria, rash, seizure activity, orthopnea, PND, pedal edema, claudication. Remaining systems are negative.  Physical Exam: Well-developed well-nourished in no acute distress.  Skin is warm and dry.  HEENT is normal.  Neck is supple.  Chest is clear to auscultation with normal expansion.  Cardiovascular exam is regular rate and rhythm.  Abdominal exam nontender or distended. No masses palpated. Extremities show no edema. neuro grossly  intact  ECG sinus bradycardia at a rate of 57. Occasional PAC. No ST changes.

## 2014-12-28 ENCOUNTER — Ambulatory Visit (INDEPENDENT_AMBULATORY_CARE_PROVIDER_SITE_OTHER): Payer: PRIVATE HEALTH INSURANCE | Admitting: Cardiology

## 2014-12-28 ENCOUNTER — Encounter: Payer: Self-pay | Admitting: Cardiology

## 2014-12-28 VITALS — BP 128/68 | HR 51 | Ht 71.0 in | Wt 222.0 lb

## 2014-12-28 DIAGNOSIS — I480 Paroxysmal atrial fibrillation: Secondary | ICD-10-CM

## 2014-12-28 NOTE — Patient Instructions (Signed)
Medication Instructions:  None   Labwork: August 1st lipid/lft  Testing/Procedures: None  Follow-Up: None  Any Other Special Instructions Will Be Listed Below (If Applicable).

## 2014-12-28 NOTE — Assessment & Plan Note (Signed)
Continue statin. Check lipids and liver. 

## 2014-12-28 NOTE — Assessment & Plan Note (Signed)
Patient remains in sinus rhythm. Continue beta blocker. No embolic risk factors. Continue aspirin.

## 2015-01-16 ENCOUNTER — Other Ambulatory Visit: Payer: PRIVATE HEALTH INSURANCE

## 2015-04-17 ENCOUNTER — Other Ambulatory Visit: Payer: Self-pay | Admitting: Cardiology

## 2015-07-07 ENCOUNTER — Encounter (INDEPENDENT_AMBULATORY_CARE_PROVIDER_SITE_OTHER): Payer: Self-pay | Admitting: Physical Medicine & Rehabilitation

## 2015-07-07 ENCOUNTER — Ambulatory Visit (INDEPENDENT_AMBULATORY_CARE_PROVIDER_SITE_OTHER): Payer: BC Managed Care – PPO | Admitting: Physical Medicine & Rehabilitation

## 2015-07-07 VITALS — BP 108/68 | HR 67 | Temp 97.4°F | Resp 14 | Ht 70.0 in | Wt 219.0 lb

## 2015-07-07 DIAGNOSIS — R52 Pain, unspecified: Secondary | ICD-10-CM

## 2015-07-09 NOTE — H&P (Signed)
[facility] Department of Neurosurgery  [address] [place], [place] [address]      HISTORY AND PHYSICAL    PATIENT NAME: Shaun Lopez, Shaun Lopez  CHART NUMBER: [id number]  DATE OF BIRTH: Jun 23, 1960  DATE OF SERVICE: 07/07/2015    [facility]  [address], [id number]  [place], [place] [address]  Telephone: [phone]   Fax:  [phone]      HISTORY OF PRESENT ILLNESS:  The patient is a 55 year old gentleman.  The patient left without being seen.  He was signed in, however.      PHYSICAL EXAMINATION:  His blood pressure was 108/68, pulse 64, temperature 97.4 degrees Fahrenheit, respirations 14, 5 feet 10 inches, O2 sat 98% and 219 pounds.      ASSESSMENT AND PLAN:  The patient basically came in for low back pain and spasm; however, he left without being seen.  The patient will be reassessed hopefully and reexamined in the near future.      Peter Garter, MD  Medical Director, [facility] [place], [place], [place] [address]    JW:[id number]; D: 07/07/2015 16:05:26; T: 07/09/2015 09:58:11

## 2016-02-21 NOTE — Progress Notes (Deleted)
      HPI: [name] is a gentleman, who has a history of paroxysmal atrial fibrillation. Note he had a prolonged vagal episode following an exercise treadmill requiring transient CPR. Echo 3/15 shows normal LV function, mild AI, mild MR, mild RVE, mild TR. Since I last saw him,   Current Outpatient Prescriptions  Medication Sig Dispense Refill  . aspirin 81 MG tablet Take 81 mg by mouth daily.    Marland Kitchen. atorvastatin (LIPITOR) 40 MG tablet TAKE ONE TABLET BY MOUTH ONCE DAILY 90 tablet 2  . metoprolol succinate (TOPROL-XL) 25 MG 24 hr tablet TAKE ONE TABLET BY MOUTH ONCE DAILY 90 tablet 2   No current facility-administered medications for this visit.      Past Medical History:  Diagnosis Date  . GERD   . HYPERLIPIDEMIA   . PAROXYSMAL ATRIAL FIBRILLATION   . Syncope and collapse     Past Surgical History:  Procedure Laterality Date  . TONSILLECTOMY      Social History   Social History  . Marital status: Single    Spouse name: N/A  . Number of children: N/A  . Years of education: N/A   Occupational History  . Not on file.   Social History Main Topics  . Smoking status: Never Smoker  . Smokeless tobacco: Not on file  . Alcohol use No  . Drug use: No  . Sexual activity: Not on file   Other Topics Concern  . Not on file   Social History Narrative  . No narrative on file    Family History  Problem Relation Age of Onset  . Coronary artery disease    . Heart attack Father     ROS: no fevers or chills, productive cough, hemoptysis, dysphasia, odynophagia, melena, hematochezia, dysuria, hematuria, rash, seizure activity, orthopnea, PND, pedal edema, claudication. Remaining systems are negative.  Physical Exam: Well-developed well-nourished in no acute distress.  Skin is warm and dry.  HEENT is normal.  Neck is supple.  Chest is clear to auscultation with normal expansion.  Cardiovascular exam is regular rate and rhythm.  Abdominal exam nontender or distended. No  masses palpated. Extremities show no edema. neuro grossly intact  ECG

## 2016-02-28 ENCOUNTER — Ambulatory Visit: Payer: PRIVATE HEALTH INSURANCE | Admitting: Cardiology

## 2016-03-14 ENCOUNTER — Ambulatory Visit
Admission: RE | Admit: 2016-03-14 | Discharge: 2016-03-14 | Disposition: A | Discharge status: Home / Self Care | Payer: BC Managed Care – PPO | Source: Ambulatory Visit | Attending: NURSE PRACTITIONER | Admitting: NURSE PRACTITIONER

## 2016-03-14 ENCOUNTER — Other Ambulatory Visit (INDEPENDENT_AMBULATORY_CARE_PROVIDER_SITE_OTHER): Payer: Self-pay | Admitting: NURSE PRACTITIONER

## 2016-03-14 ENCOUNTER — Ambulatory Visit (HOSPITAL_BASED_OUTPATIENT_CLINIC_OR_DEPARTMENT_OTHER)
Admission: RE | Admit: 2016-03-14 | Discharge: 2016-03-14 | Disposition: A | Discharge status: Home / Self Care | Payer: BC Managed Care – PPO | Source: Ambulatory Visit | Attending: NURSE PRACTITIONER | Admitting: NURSE PRACTITIONER

## 2016-03-14 ENCOUNTER — Ambulatory Visit (INDEPENDENT_AMBULATORY_CARE_PROVIDER_SITE_OTHER): Payer: BC Managed Care – PPO | Admitting: NURSE PRACTITIONER

## 2016-03-14 ENCOUNTER — Encounter (INDEPENDENT_AMBULATORY_CARE_PROVIDER_SITE_OTHER): Payer: Self-pay | Admitting: NURSE PRACTITIONER

## 2016-03-14 VITALS — BP 116/80 | HR 63 | Temp 97.6°F | Resp 16 | Ht 70.0 in | Wt 214.0 lb

## 2016-03-14 DIAGNOSIS — M25559 Pain in unspecified hip: Secondary | ICD-10-CM

## 2016-03-14 DIAGNOSIS — M5136 Other intervertebral disc degeneration, lumbar region: Secondary | ICD-10-CM | POA: Insufficient documentation

## 2016-03-14 DIAGNOSIS — M47816 Spondylosis without myelopathy or radiculopathy, lumbar region: Secondary | ICD-10-CM

## 2016-03-14 DIAGNOSIS — M5416 Radiculopathy, lumbar region: Secondary | ICD-10-CM

## 2016-03-14 NOTE — H&P (Signed)
Physical Medicine and Rehabilitation New Patient Visit  Patient Name: Shaun Lopez  MRN: [id number]  DOB: [birth year]  DOS: 03/14/2016    CC:   Chief Complaint   Patient presents with    Low Back Pain    Hip Pain     RIGHT    Right Leg Pain    Numbness     AND TINGLING-OFF AND ON SINCE APRPOX MARCH-CONSISTENTLY SINCE 03-10-16    Right Leg Weakness       HPI: Shaun Lopez is a 55 y.o. male who presents to [facility] [employer] [place] at [facility] for initial evaluation for the above stated complaint. He feels he has had pain for a few years but recently it became worse. He describes it as mainly right sided lumbosacral pain, chronic, dull aching, intermittently sharp pain that will radiate down the side and back of right leg to the foot at times. Worse with sitting for extended time. Better with lying down. He does feel the pain has improved over the last few days. The pain in the right leg has eased significantly.  Prior and/or current treatments include Chiropractor, home physical therapy program.    ROS: Positive for above stated complaint/HPI. Negative for change in weight, fever, chills, or night sweats. Negative for rash. Negative for vision changes. Negative for chest pain, shortness of breath, or palpitations. Negative for loss of bowel or bladder function or saddle parathesias. All other systems negative and noncontributory.  Please see scanned new patient packet.      PAST MEDICAL HISTORY:  Past Medical History:   Diagnosis Date    Cancer ([place])     skin 2007    High cholesterol     Melanoma ([place])     2007    Varicella            PAST SURGICAL HISTORY:  Past Surgical History:   Procedure Laterality Date    COLONOSCOPY      HX OTHER      Melanoma removal    HX TONSILLECTOMY             SOCIAL HISTORY:  Social History     Social History    Marital status: Single     Spouse name: N/A    Number of children: N/A    Years of education: N/A     Occupational History     Not on file.     Social History Main Topics    Smoking status: Never Smoker    Smokeless tobacco: Never Used    Alcohol use No    Drug use: No    Sexual activity: Not on file     Other Topics Concern    Right Hand Dominant Yes     Social History Narrative       FAMILY HISTORY:  Family Medical History     Problem Relation (Age of Onset)    Arthritis-rheumatoid Mother    Cancer Sister    Colon Cancer Maternal Grandfather    Heart Attack Father    Other Mother              MEDICATIONS:  Outpatient Prescriptions Marked as Taking for the 03/14/16 encounter (Office Visit) with Lilian Kapur, APRN,NP-C   Medication Sig    aspirin (ECOTRIN) 81 mg Oral Tablet, Delayed Release (E.C.) Take 81 mg by mouth Once a day    atorvastatin (LIPITOR) 40 mg Oral Tablet Take 40 mg by  mouth Every evening    LANSOPRAZOLE (PREVACID ORAL) Take 40 mg by mouth Once a day    metoprolol succinate (TOPROL XL) 25 mg Oral Tablet Sustained Release 24 hr Take 25 mg by mouth Once a day       ALLERGIES:  Allergies   Allergen Reactions    Penicillins  Other Adverse Reaction (Add comment)     Kidney problems      Clindamycin Diarrhea         PHYSICAL EXAMINATION    CONSTITUTIONAL: A pleasant 55 y.o. year old. Alert and oriented x 3. Cooperative. No acute distress noted.  VITAL SIGNS: BP 116/80   Pulse 63   Temp 36.4 C (97.6 F) (Tympanic)    Resp 16   Ht 1.778 m (5\' 10" )   Wt 97.1 kg (214 lb)   BMI 30.71 kg/m2  INTEGUMENTARY: Warm, dry, no erythema or rashes noted on exposed skin.  HEENT: Head is normocephalic, atraumatic. PERRLA. Pupils are 2 mm bilaterally.  RESPIRATORY: Respirations are even and non-labored.  GASTROINTESTINAL: Soft, non-tender, non-distended. Core strength is fair.  MUSCULOSKELETAL: Muscle tone of the upper and lower extremities are normal. Hip flexion pain limited on right 4/5, normal on left.Tight hamstrings noted. Right bursa and ischium slightly tender to palpation. Hip extension and internal rotation limited  bilaterally. Knee flexion and extension normal. Dorsiflexion\Plantar Flexion normal bilaterally. Upper extremity strength 5/5 bilaterally. Grip Strength 5/5 bilaterally. Lumbar forward flexion tolerated to a normal degree. Lumbar extension tolerated well, slightly limited. Lumbar lateral bending normal. Straight leg test negative but reproduced some discomfort in extremity. Gait is normal.  NEUROLOGIC: Patient is alert and oriented x 3 with normal speech. Attention span and concentration normal. Cognitive function is normal. Coordination is normal. Cranial nerves 2-12 intact. Sensation is normal.  Lower extremity reflexes are intact, hypoactive.        DIAGNOSTIC STUDIES: MRI lumbar spine from [facility] images not available for review. Report states: L5-S1 mild disc bulge and 4 mm left paracentral disc protrusion resulting in posterior deviation of the left S1 nerve root. The mild disc bulge and facet arthrosis also result in moderate bilateral neural foraminal stenosis without mass effect on the exiting nerve roots. L4-5 mild disc bulge and facet arthrosis resulting in moderate left foraminal stenosis. L2-3 mild retrolisthesis with uncovered disc moderate disc bulge eccentric to the left without mass effect on the nerve roots. L1-2 mild to moderate disc bulge eccentric to the left without mass effect on the nerve roots.    ASSESSMENT:     ICD-10-CM    1. Lumbar spondylosis M47.816 XR LUMBAR SPINE W/FLEX EXTENSION   2. Lumbar radiculopathy M54.16 XR LUMBAR SPINE W/FLEX EXTENSION   3. Hip pain M25.559 XR LUMBAR SPINE W/FLEX EXTENSION       PLAN: Xray of the lumbar spine and right hip. Patient is to begin physical therapy. F/U 6-7 weeks. Patient to bring disc of MRI at next visit for review. Impression, treatment, and plan of care discussed with patient. Patient is in agreement to the recommendations.     The patient was seen and examined independently with collaborating physician present in clinic.

## 2016-05-02 ENCOUNTER — Encounter (INDEPENDENT_AMBULATORY_CARE_PROVIDER_SITE_OTHER): Payer: BC Managed Care – PPO | Admitting: NURSE PRACTITIONER

## 2016-05-06 ENCOUNTER — Encounter (INDEPENDENT_AMBULATORY_CARE_PROVIDER_SITE_OTHER): Payer: Self-pay | Admitting: NURSE PRACTITIONER

## 2016-05-06 ENCOUNTER — Ambulatory Visit (INDEPENDENT_AMBULATORY_CARE_PROVIDER_SITE_OTHER): Payer: BC Managed Care – PPO | Admitting: NURSE PRACTITIONER

## 2016-05-06 VITALS — BP 102/84 | HR 51 | Temp 96.5°F | Resp 14 | Ht 71.0 in | Wt 180.0 lb

## 2016-05-06 DIAGNOSIS — Z6825 Body mass index (BMI) 25.0-25.9, adult: Secondary | ICD-10-CM

## 2016-05-06 DIAGNOSIS — M47816 Spondylosis without myelopathy or radiculopathy, lumbar region: Secondary | ICD-10-CM

## 2016-05-06 NOTE — Progress Notes (Signed)
Physical Medicine and Rehabilitation Return Patient Visit  Patient Name: Shaun Lopez  MRN: [id number]  DOB: 05-22-1961  DOS: 05/06/2016    CC:   Chief Complaint   Patient presents with    Physical Therapy     follow up       HPI: Shaun Lopez is a 55 y.o. male who presents to [facility] [place] at [facility] for a follow-up evaluation for the above stated complaint. He has a known history of lumbar spondylosis. Since the last visit he has attended physical therapy for approximately 4 sessions and has been doing a home program. Overall he feels improved. His leg pain is gone. He complains of intermittent low back pain made worse by sitting for extended time. Chiropractic care is helpful.  Prior and/or current treatments include chiropractor, physical therapy.  ROS: Positive for above stated complaint/HPI. Negative for change in weight, fever, chills, or night sweats. Negative for rash. Negative for vision changes. Negative for chest pain, shortness of breath, or palpitations. Negative for loss of bowel or bladder function or saddle paresthesias. All other systems negative and noncontributory.        PAST MEDICAL HISTORY:  Past Medical History:   Diagnosis Date    Cancer ([place])     skin 2007    High cholesterol     Melanoma ([place])     2007    Varicella            PAST SURGICAL HISTORY:  Past Surgical History:   Procedure Laterality Date    COLONOSCOPY      HX OTHER      Melanoma removal    HX TONSILLECTOMY             SOCIAL HISTORY:  Social History     Social History    Marital status: Single     Spouse name: N/A    Number of children: N/A    Years of education: N/A     Occupational History    Not on file.     Social History Main Topics    Smoking status: Never Smoker    Smokeless tobacco: Never Used    Alcohol use No    Drug use: No    Sexual activity: Not on file     Other Topics Concern    Right Hand Dominant Yes     Social History Narrative       FAMILY  HISTORY:  Family Medical History     Problem Relation (Age of Onset)    Arthritis-rheumatoid Mother    Cancer Sister    Colon Cancer Maternal Grandfather    Heart Attack Father    Other Mother              MEDICATIONS:  Outpatient Prescriptions Marked as Taking for the 05/06/16 encounter (Office Visit) with Lilian Kapur, APRN,NP-C   Medication Sig    aspirin (ECOTRIN) 81 mg Oral Tablet, Delayed Release (E.C.) Take 81 mg by mouth Once a day    atorvastatin (LIPITOR) 40 mg Oral Tablet Take 40 mg by mouth Every evening    LANSOPRAZOLE (PREVACID ORAL) Take 40 mg by mouth Once a day    metoprolol succinate (TOPROL XL) 25 mg Oral Tablet Sustained Release 24 hr Take 25 mg by mouth Once a day       ALLERGIES:  Allergies   Allergen Reactions    Penicillins  Other Adverse Reaction (Add comment)  Kidney problems      Clindamycin Diarrhea         PHYSICAL EXAMINATION    CONSTITUTIONAL: A pleasant 55 y.o. year old. Alert and oriented x 3. Cooperative. No acute distress noted.  VITAL SIGNS: BP 102/84   Pulse 51   Temp 35.8 C (96.5 F) (Tympanic)    Resp 14   Ht 1.803 m (5\' 11" )   Wt 81.6 kg (180 lb)   SpO2 98%   BMI 25.1 kg/m2  INTEGUMENTARY: Warm, dry, no erythema or rashes noted on exposed skin.  HEENT: Head is normocephalic, atraumatic. PERRLA.   GASTROINTESTINAL: Soft, non-tender, non-distended. Core strength is fair.  MUSCULOSKELETAL: Muscle tone of the upper and lower extremities are normal. Hip flexion normal bilaterally. Tight hamstrings noted. Hip extension and internal rotation limited bilaterally. Knee flexion and extension normal. Dorsiflexion\Plantar Flexion normal bilaterally. Upper extremity strength 5/5 bilaterally. Grip Strength 5/5 bilaterally. Lumbar forward flexion tolerated to a normal degree. Lumbar extension tolerated well, limited rom. Lumbar lateral bending normal. Straight leg test negative. Gait is normal.  NEUROLOGIC: Patient is alert and oriented x 3 with normal speech. Attention span and  concentration normal. Cognitive function is normal. Coordination is normal. Cranial nerves 2-12 intact. Sensation is normal.  Lower extremity reflexes are intact, hypoactive.        DIAGNOSTIC STUDIES: MRI lumbar spine from [facility] reviewed with the patient today. Report states: L5-S1 mild disc bulge and 4 mm left paracentral disc protrusion resulting in posterior deviation of the left S1 nerve root. The mild disc bulge and facet arthrosis also result in moderate bilateral neural foraminal stenosis without mass effect on the exiting nerve roots. L4-5 mild disc bulge and facet arthrosis resulting in moderate left foraminal stenosis. L2-3 mild retrolisthesis with uncovered disc moderate disc bulge eccentric to the left without mass effect on the nerve roots. L1-2 mild to moderate disc bulge eccentric to the left without mass effect on the nerve roots.    ASSESSMENT:     ICD-10-CM    1. Lumbar spondylosis M47.816        PLAN: Patient is improved. He is encouraged to continue home pt program. F/U as needed. Impression, treatment, and plan of care discussed with patient. Patient is in agreement to the recommendations.     The patient was seen and examined independently with collaborating physician present in clinic.

## 2016-05-30 NOTE — Progress Notes (Signed)
      HPI: FU paroxysmal atrial fibrillation. Note he had a prolonged vagal episode following an exercise treadmill requiring transient CPR. Echo 3/15 shows normal LV function, mild AI, mild MR, mild RVE, mild TR. Since I last saw him, the patient has dyspnea with more extreme activities but not with routine activities. It is relieved with rest. It is not associated with chest pain. There is no orthopnea, PND or pedal edema. There is no syncope or palpitations. There is no exertional chest pain.   Current Outpatient Prescriptions  Medication Sig Dispense Refill  . lansoprazole (PREVACID) 15 MG capsule Take 15 mg by mouth daily at 12 noon.    Marland Kitchen. aspirin 81 MG tablet Take 81 mg by mouth daily.    Marland Kitchen. atorvastatin (LIPITOR) 40 MG tablet TAKE ONE TABLET BY MOUTH ONCE DAILY 90 tablet 2  . metoprolol succinate (TOPROL-XL) 25 MG 24 hr tablet TAKE ONE TABLET BY MOUTH ONCE DAILY 90 tablet 2   No current facility-administered medications for this visit.      Past Medical History:  Diagnosis Date  . GERD   . HYPERLIPIDEMIA   . PAROXYSMAL ATRIAL FIBRILLATION   . Syncope and collapse     Past Surgical History:  Procedure Laterality Date  . TONSILLECTOMY      Social History   Social History  . Marital status: Single    Spouse name: N/A  . Number of children: N/A  . Years of education: N/A   Occupational History  . Not on file.   Social History Main Topics  . Smoking status: Never Smoker  . Smokeless tobacco: Not on file  . Alcohol use No  . Drug use: No  . Sexual activity: Not on file   Other Topics Concern  . Not on file   Social History Narrative  . No narrative on file    Family History  Problem Relation Age of Onset  . Coronary artery disease    . Heart attack Father     ROS: no fevers or chills, productive cough, hemoptysis, dysphasia, odynophagia, melena, hematochezia, dysuria, hematuria, rash, seizure activity, orthopnea, PND, pedal edema, claudication. Remaining  systems are negative.  Physical Exam: Well-developed well-nourished in no acute distress.  Skin is warm and dry.  HEENT is normal.  Neck is supple.  Chest is clear to auscultation with normal expansion.  Cardiovascular exam is regular rate and rhythm.  Abdominal exam nontender or distended. No masses palpated. Extremities show no edema. neuro grossly intact  ECG-Sinus bradycardia, left axis deviation.  A/P  1 Hyperlipidemia-continue statin.   2 paroxysmal atrial fibrillation-patient remains in sinus rhythm. Continue beta blocker. He has no embolic risk factors. Continue aspirin.  Olga MillersBrian Adolphe Fortunato, MD

## 2016-05-31 ENCOUNTER — Encounter: Payer: Self-pay | Admitting: Cardiology

## 2016-05-31 ENCOUNTER — Ambulatory Visit (INDEPENDENT_AMBULATORY_CARE_PROVIDER_SITE_OTHER): Payer: BLUE CROSS/BLUE SHIELD | Admitting: Cardiology

## 2016-05-31 VITALS — BP 110/72 | HR 55 | Ht 71.0 in | Wt 226.0 lb

## 2016-05-31 DIAGNOSIS — I4891 Unspecified atrial fibrillation: Secondary | ICD-10-CM

## 2016-05-31 DIAGNOSIS — E7800 Pure hypercholesterolemia, unspecified: Secondary | ICD-10-CM

## 2016-05-31 NOTE — Patient Instructions (Signed)
Your physician wants you to follow-up in: ONE YEAR WITH DR CRENSHAW You will receive a reminder letter in the mail two months in advance. If you don't receive a letter, please call our office to schedule the follow-up appointment.   If you need a refill on your cardiac medications before your next appointment, please call your pharmacy.  

## 2016-06-12 ENCOUNTER — Telehealth: Payer: Self-pay | Admitting: Cardiology

## 2016-06-25 NOTE — Progress Notes (Deleted)
      HPI: FU paroxysmal atrial fibrillation. Note he had a prolonged vagal episode following an exercise treadmill requiring transient CPR. Echo 3/15 shows normal LV function, mild AI, mild MR, mild RVE, mild TR. Since I last saw him,   Current Outpatient Prescriptions  Medication Sig Dispense Refill  . aspirin 81 MG tablet Take 81 mg by mouth daily.    Marland Kitchen. atorvastatin (LIPITOR) 40 MG tablet TAKE ONE TABLET BY MOUTH ONCE DAILY 90 tablet 2  . lansoprazole (PREVACID) 15 MG capsule Take 15 mg by mouth daily at 12 noon.    . metoprolol succinate (TOPROL-XL) 25 MG 24 hr tablet TAKE ONE TABLET BY MOUTH ONCE DAILY 90 tablet 2   No current facility-administered medications for this visit.      Past Medical History:  Diagnosis Date  . GERD   . HYPERLIPIDEMIA   . PAROXYSMAL ATRIAL FIBRILLATION   . Syncope and collapse     Past Surgical History:  Procedure Laterality Date  . TONSILLECTOMY      Social History   Social History  . Marital status: Single    Spouse name: N/A  . Number of children: N/A  . Years of education: N/A   Occupational History  . Not on file.   Social History Main Topics  . Smoking status: Never Smoker  . Smokeless tobacco: Not on file  . Alcohol use No  . Drug use: No  . Sexual activity: Not on file   Other Topics Concern  . Not on file   Social History Narrative  . No narrative on file    Family History  Problem Relation Age of Onset  . Coronary artery disease    . Heart attack Father     ROS: no fevers or chills, productive cough, hemoptysis, dysphasia, odynophagia, melena, hematochezia, dysuria, hematuria, rash, seizure activity, orthopnea, PND, pedal edema, claudication. Remaining systems are negative.  Physical Exam: Well-developed well-nourished in no acute distress.  Skin is warm and dry.  HEENT is normal.  Neck is supple.  Chest is clear to auscultation with normal expansion.  Cardiovascular exam is regular rate and rhythm.    Abdominal exam nontender or distended. No masses palpated. Extremities show no edema. neuro grossly intact  ECG

## 2016-06-27 ENCOUNTER — Other Ambulatory Visit: Payer: Self-pay

## 2016-06-28 ENCOUNTER — Encounter: Payer: Self-pay | Admitting: *Deleted

## 2016-06-28 ENCOUNTER — Ambulatory Visit: Payer: BLUE CROSS/BLUE SHIELD | Admitting: Cardiology

## 2016-07-19 ENCOUNTER — Other Ambulatory Visit: Payer: Self-pay | Admitting: Cardiology

## 2016-10-05 ENCOUNTER — Ambulatory Visit
Admission: RE | Admit: 2016-10-05 | Discharge: 2016-10-05 | Disposition: A | Discharge status: Home / Self Care | Payer: BC Managed Care – PPO | Source: Ambulatory Visit | Attending: Physician Assistant | Admitting: Physician Assistant

## 2016-10-05 ENCOUNTER — Other Ambulatory Visit (HOSPITAL_COMMUNITY): Payer: Self-pay | Admitting: Physician Assistant

## 2016-10-05 ENCOUNTER — Ambulatory Visit (HOSPITAL_COMMUNITY): Payer: BC Managed Care – PPO

## 2016-10-05 DIAGNOSIS — R05 Cough: Principal | ICD-10-CM

## 2016-10-05 DIAGNOSIS — R059 Cough, unspecified: Secondary | ICD-10-CM

## 2016-10-05 LAB — CBC WITH DIFF
BASOPHIL #: 0 x10ˆ3/uL (ref 0.00–0.20)
BASOPHIL %: 0 %
EOSINOPHIL #: 0 x10ˆ3/uL (ref 0.00–0.50)
EOSINOPHIL %: 0 %
HCT: 41.4 % (ref 38.9–50.5)
HGB: 14.4 g/dL (ref 13.4–17.3)
LYMPHOCYTE #: 1.4 x10ˆ3/uL (ref 0.80–3.20)
LYMPHOCYTE %: 15 %
MCH: 30.2 pg (ref 27.9–33.1)
MCHC: 34.7 g/dL (ref 32.8–36.0)
MCV: 87.2 fL (ref 82.4–95.0)
MONOCYTE #: 0.3 x10ˆ3/uL (ref 0.20–0.80)
MONOCYTE %: 3 %
MPV: 8.7 fL (ref 6.0–10.2)
NEUTROPHIL #: 7.5 x10ˆ3/uL — ABNORMAL HIGH (ref 1.60–5.50)
NEUTROPHIL %: 82 %
PLATELETS: 249 x10ˆ3/uL (ref 140–440)
RBC: 4.75 x10ˆ6/uL (ref 4.40–5.68)
RDW: 13.5 % (ref 10.9–15.1)
RDW: 13.5 % (ref 10.9–15.1)
WBC: 9.2 x10ˆ3/uL (ref 3.3–9.3)

## 2017-04-30 ENCOUNTER — Encounter (INDEPENDENT_AMBULATORY_CARE_PROVIDER_SITE_OTHER): Payer: Self-pay | Admitting: Family Medicine

## 2017-04-30 ENCOUNTER — Ambulatory Visit (INDEPENDENT_AMBULATORY_CARE_PROVIDER_SITE_OTHER): Payer: Self-pay | Admitting: Family Medicine

## 2017-04-30 DIAGNOSIS — K219 Gastro-esophageal reflux disease without esophagitis: Secondary | ICD-10-CM | POA: Insufficient documentation

## 2017-04-30 DIAGNOSIS — E7800 Pure hypercholesterolemia, unspecified: Secondary | ICD-10-CM

## 2017-04-30 DIAGNOSIS — I480 Paroxysmal atrial fibrillation: Secondary | ICD-10-CM

## 2017-04-30 NOTE — Progress Notes (Signed)
[facility]  [address]  [place] [id number]        Encounter Date: 04/30/2017  2:45 PM EST      Name: Shaun Lopez Shaun Lopez  Age: 56 y.o.  DOB: 04-09-1961  Sex: male    Chief Complaint:   Chief Complaint   Patient presents with   . New Patient       HPI  Patient here to establish his new patient.  He reports he has a medical history including some paroxysmal atrial fibrillation that he used to be on flecainide as well but after years of not requiring any sort of treatments for it he is on metoprolol alone.  He never had a formal diagnosis of hypertension or other clotting risk factors prior so he is only on the metoprolol at aspirin at this time for that.  He has been identified to have hyperlipidemia as well and he takes Lipitor without myalgia.  He has a history of GERD in the past as well and he does take some Prevacid for this.  Overall he feels like he is relatively healthy but he does admit that he does not have time dedicated to exercise like he could.  He does eat home style cooking and spent some time traveling back and forth between [place] in [place] and spending more time in [place] due to his aging mother.  He wanted to establish local care as he does not have a primary in a while and being closer to [place] he wanted to get a primary care physician.      History   Family Medical History:     Problem Relation (Age of Onset)    Arthritis-rheumatoid Mother    Cancer Sister    Colon Cancer Maternal Grandfather    Heart Attack Father    Hypertension Mother    Other Mother        Past Medical History:   Diagnosis Date   . Atrial fibrillation (CMS HCC)    . Cancer (CMS [place])     skin 2007   . Esophageal reflux    . High cholesterol    . Melanoma (CMS [place])     2007   . Varicella      Past Surgical History:   Procedure Laterality Date   . COLONOSCOPY     . HX OTHER      Melanoma removal   . HX TONSILLECTOMY     . HX WISDOM TEETH EXTRACTION       Patient Active Problem List     Diagnosis   . PAF (paroxysmal atrial fibrillation) (CMS HCC)   . GERD (gastroesophageal reflux disease)   . Pure hypercholesterolemia     Current Outpatient Prescriptions   Medication Sig   . aspirin (ECOTRIN) 81 mg Oral Tablet, Delayed Release (E.C.) Take 81 mg by mouth Once a day   . atorvastatin (LIPITOR) 40 mg Oral Tablet Take 40 mg by mouth Every evening   . LANSOPRAZOLE (PREVACID ORAL) Take 40 mg by mouth Once a day   . metoprolol succinate (TOPROL XL) 25 mg Oral Tablet Sustained Release 24 hr Take 25 mg by mouth Once a day     History   Smoking Status   . Never Smoker   Smokeless Tobacco   . Never Used     History   Alcohol Use No       Review of Systems   Constitutional: Negative for chills, fatigue and  fever.   HENT: Negative for nosebleeds, sore throat and trouble swallowing.    Eyes: Negative for photophobia and pain.   Respiratory: Negative for cough and wheezing.    Cardiovascular: Negative for chest pain and leg swelling.   Gastrointestinal: Negative for abdominal pain, constipation and diarrhea.   Endocrine: Negative for cold intolerance and heat intolerance.   Genitourinary: Negative for dysuria and urgency.   Musculoskeletal: Negative for back pain and joint swelling.   Neurological: Negative for seizures, syncope and headaches.   Hematological: Negative for adenopathy. Does not bruise/bleed easily.   Psychiatric/Behavioral: Negative for confusion and hallucinations. The patient is not nervous/anxious.         Examination  Vitals: BP 118/80  Pulse 62  Temp 36.7 C (98.1 F)  Resp 18  Ht 1.803 m (5\' 11" )  Wt 101.2 kg (223 lb)  SpO2 98%  BMI 31.1 kg/m2      Physical Exam   Constitutional: He is oriented to person, place, and time. He appears well-developed and well-nourished.   HENT:   Head: Normocephalic and atraumatic.   Eyes: Pupils are equal, round, and reactive to light. No scleral icterus.   Neck: Neck supple.   Cardiovascular: Normal rate and regular rhythm.    Pulmonary/Chest: Effort  normal and breath sounds normal. He has no wheezes. He has no rales.   Abdominal: Soft. Bowel sounds are normal. There is no tenderness.   Musculoskeletal: He exhibits no edema.   Lymphadenopathy:     He has no cervical adenopathy.   Neurological: He is alert and oriented to person, place, and time. No cranial nerve deficit.   Skin: Skin is warm. No rash noted.   Psychiatric: He has a normal mood and affect. His behavior is normal.       Assessment and Plan  Shaun Lopez was seen today for new patient.    PAF (paroxysmal atrial fibrillation) (CMS HCC)    Gastroesophageal reflux disease, esophagitis presence not specified    Pure hypercholesterolemia  -     CBC; Future  -     COMPREHENSIVE METABOLIC PNL, FASTING; Future  -     CREATINE KINASE (CK), TOTAL, SERUM; Future  -     LIPID PANEL; Future      We actually have access to his old medical records through the other physicians that were [facility].  His echo in 2015 showed preserved ejection fraction without significant valvular disease. Will follow up in 6 months or p.r.n..  We gave an order for fasting blood work that he is going to get a health care facility a little closer to home.  He is welcome to call us if he needs [place] in the interim.      Hollace Kinnier, MD

## 2017-05-02 ENCOUNTER — Other Ambulatory Visit (INDEPENDENT_AMBULATORY_CARE_PROVIDER_SITE_OTHER): Payer: Self-pay | Admitting: Family Medicine

## 2017-05-02 DIAGNOSIS — E7800 Pure hypercholesterolemia, unspecified: Secondary | ICD-10-CM

## 2017-05-05 ENCOUNTER — Encounter (INDEPENDENT_AMBULATORY_CARE_PROVIDER_SITE_OTHER): Payer: Self-pay

## 2017-05-05 NOTE — Telephone Encounter (Signed)
-----   Message from Hollace Kinnier, MD sent at 05/04/2017  9:15 AM EST -----  Sugar, liver, kidney, and blood counts are all in stable ranges.  Cholesterol is also in acceptable range  With slightly high triglycerides.  Make sure watching carbohydrates and sweets in diet.

## 2017-05-05 NOTE — Telephone Encounter (Signed)
Message with results sent in My Chart

## 2017-05-10 ENCOUNTER — Other Ambulatory Visit: Payer: Self-pay

## 2017-08-12 ENCOUNTER — Ambulatory Visit: Payer: BLUE CROSS/BLUE SHIELD | Admitting: Cardiology

## 2017-08-20 ENCOUNTER — Encounter: Payer: Self-pay | Admitting: Cardiology

## 2017-08-22 NOTE — Progress Notes (Signed)
HPI: FU paroxysmal atrial fibrillation. Note he had a prolonged vagal episode following an exercise treadmill requiring transient CPR. Echo 3/15 shows normal LV function, mild AI, mild MR, mild RVE, mild TR. Since I last saw him, the patient has dyspnea with more extreme activities but not with routine activities. It is relieved with rest. It is not associated with chest pain. There is no orthopnea, PND or pedal edema. There is no syncope. There is no exertional chest pain.  He notes occasional heart racing when he awakes suddenly at night.   Current Outpatient Medications  Medication Sig Dispense Refill  . aspirin 81 MG tablet Take 81 mg by mouth daily.    Marland Kitchen. atorvastatin (LIPITOR) 40 MG tablet TAKE ONE TABLET BY MOUTH ONCE DAILY 90 tablet 2  . lansoprazole (PREVACID) 15 MG capsule Take 15 mg by mouth daily at 12 noon.    . metoprolol succinate (TOPROL-XL) 25 MG 24 hr tablet TAKE ONE TABLET BY MOUTH ONCE DAILY 90 tablet 2   No current facility-administered medications for this visit.      Past Medical History:  Diagnosis Date  . GERD   . HYPERLIPIDEMIA   . PAROXYSMAL ATRIAL FIBRILLATION   . Syncope and collapse     Past Surgical History:  Procedure Laterality Date  . TONSILLECTOMY      Social History   Socioeconomic History  . Marital status: Single    Spouse name: Not on file  . Number of children: Not on file  . Years of education: Not on file  . Highest education level: Not on file  Social Needs  . Financial resource strain: Not on file  . Food insecurity - worry: Not on file  . Food insecurity - inability: Not on file  . Transportation needs - medical: Not on file  . Transportation needs - non-medical: Not on file  Occupational History  . Not on file  Tobacco Use  . Smoking status: Never Smoker  . Smokeless tobacco: Never Used  Substance and Sexual Activity  . Alcohol use: No  . Drug use: No  . Sexual activity: Not on file  Other Topics Concern  . Not  on file  Social History Narrative  . Not on file    Family History  Problem Relation Age of Onset  . Heart attack Father   . Coronary artery disease Unknown     ROS: Patient describes severe snoring but no fevers or chills, productive cough, hemoptysis, dysphasia, odynophagia, melena, hematochezia, dysuria, hematuria, rash, seizure activity, orthopnea, PND, pedal edema, claudication. Remaining systems are negative.  Physical Exam: Well-developed well-nourished in no acute distress.  Skin is warm and dry.  HEENT is normal.  Neck is supple.  Chest is clear to auscultation with normal expansion.  Cardiovascular exam is regular rate and rhythm.  Abdominal exam nontender or distended. No masses palpated. Extremities show no edema. neuro grossly intact  ECG-sinus bradycardia, left axis deviation.  Personally reviewed  A/P  1 paroxysmal atrial fibrillation-patient remains in sinus rhythm today.  We will continue with beta blockade for rate control if atrial fibrillation recurs. CHADSvasc 0. Continue ASA.  He has had occasional palpitations when awakening suddenly at night.  If this becomes problematic we will arrange a monitor to further assess.  2 hyperlipidemia-continue statin.  Check lipids and liver.  3 snoring-patient thinks he may have sleep apnea.  I will ask pulmonary to review for possible sleep study.  Olga MillersBrian Crenshaw, MD

## 2017-08-27 ENCOUNTER — Ambulatory Visit (INDEPENDENT_AMBULATORY_CARE_PROVIDER_SITE_OTHER): Payer: BLUE CROSS/BLUE SHIELD | Admitting: Cardiology

## 2017-08-27 ENCOUNTER — Encounter: Payer: Self-pay | Admitting: Cardiology

## 2017-08-27 VITALS — BP 138/87 | HR 55 | Ht 71.0 in | Wt 225.0 lb

## 2017-08-27 DIAGNOSIS — G4730 Sleep apnea, unspecified: Secondary | ICD-10-CM | POA: Diagnosis not present

## 2017-08-27 DIAGNOSIS — I480 Paroxysmal atrial fibrillation: Secondary | ICD-10-CM

## 2017-08-27 DIAGNOSIS — E785 Hyperlipidemia, unspecified: Secondary | ICD-10-CM | POA: Diagnosis not present

## 2017-08-27 NOTE — Patient Instructions (Signed)
Medication Instructions:  Your physician recommends that you continue on your current medications as directed. Please refer to the Current Medication list given to you today.   Labwork: Your physician recommends that you return for a FASTING lipid profile and liver/bring paperwork.   Testing/Procedures: -None  Follow-Up: Your physician wants you to follow-up in: 1 year with Dr. Jens Somrenshaw.  You will receive a reminder letter in the mail two months in advance. If you don't receive a letter, please call our office to schedule the follow-up appointment. You have been referred to Pulmonology.     Any Other Special Instructions Will Be Listed Below (If Applicable).     If you need a refill on your cardiac medications before your next appointment, please call your pharmacy.  2

## 2017-09-09 ENCOUNTER — Telehealth: Payer: Self-pay | Admitting: Cardiology

## 2017-09-09 NOTE — Telephone Encounter (Signed)
Probably not cardiac related; would fu with his primary care Olga MillersBrian Erman Thum

## 2017-09-09 NOTE — Telephone Encounter (Signed)
Returned call to patient.He stated he wanted to let Dr.Crenshaw know he is having numbness in both arms,right is worse.Numbness has been more consistent for the past 2 weeks.No chest pain.No sob.Message sent to [facility]renshaw for advice.

## 2017-09-09 NOTE — Telephone Encounter (Signed)
New message  Pt verbalized that he is having numbness in his right arm mainly but also in the left   Please call pt

## 2017-09-10 NOTE — Telephone Encounter (Signed)
Patient called w/MD advice °

## 2017-10-16 ENCOUNTER — Encounter: Payer: Self-pay | Admitting: Pulmonary Disease

## 2017-10-16 ENCOUNTER — Ambulatory Visit (INDEPENDENT_AMBULATORY_CARE_PROVIDER_SITE_OTHER): Payer: BLUE CROSS/BLUE SHIELD | Admitting: Pulmonary Disease

## 2017-10-16 VITALS — BP 126/79 | HR 52 | Ht 70.0 in | Wt 226.0 lb

## 2017-10-16 DIAGNOSIS — R0683 Snoring: Secondary | ICD-10-CM | POA: Diagnosis not present

## 2017-10-16 NOTE — Progress Notes (Signed)
   Subjective:    Patient ID: Colton Moore, male    DOB: 1961/03/22, 57 y.o.   MRN: [id number]  HPI    Review of Systems  Constitutional: Negative for fever and unexpected weight change.  HENT: Positive for congestion, ear pain, sinus pressure and sneezing. Negative for dental problem, nosebleeds, postnasal drip, rhinorrhea, sore throat and trouble swallowing.   Eyes: Negative for redness and itching.  Respiratory: Negative for cough, chest tightness, shortness of breath and wheezing.   Cardiovascular: Positive for palpitations. Negative for leg swelling.  Gastrointestinal: Negative for nausea and vomiting.  Genitourinary: Negative for dysuria.  Musculoskeletal: Negative for joint swelling.  Skin: Negative for rash.  Allergic/Immunologic: Positive for environmental allergies. Negative for food allergies and immunocompromised state.  Neurological: Positive for headaches.  Hematological: Bruises/bleeds easily.  Psychiatric/Behavioral: Negative for dysphoric mood. The patient is not nervous/anxious.        Objective:   Physical Exam        Assessment & Plan:

## 2017-10-16 NOTE — Progress Notes (Signed)
[facility]  Chief Complaint  Patient presents with  . sleep consult    Pt snores loudly and gasps for air at times.    Vital signs: BP 126/79 (BP Location: Left Arm, Cuff Size: Normal)   Pulse (!) 52   Ht  (1.778 m)   Wt 226 lb (102.5 kg)   SpO2 96%   BMI 32.43 kg/m   History of Present Illness: Colton Moore is a 57 y.o. male for evaluation of sleep problems.  He is followed by cardiology for paroxysmal atrial fibrillation. He was noted to have snoring, and waking up at night with his heart racing.  He stays with his mother 2 weeks a month.  She has told him that he will stop breathing while asleep.  He has trouble sleeping on his back.  He doesn't dream much.  He can fall asleep whenever he is sitting quiet.  His dentist has told him that he grinds his teeth, but he doesn't wear a mouth guard.  His father had sleep apnea.  He goes to sleep at 12 am.  He falls asleep immediately.  He wakes up some times to use the bathroom.  He gets out of bed at 6 am.  He feels sometimes feels tired in the morning.  He sometimes gets morning headache.  He does not use anything to help him fall sleep or stay awake.  He denies sleep walking, sleep talking, or nightmares.  There is no history of restless legs.  He denies sleep hallucinations, sleep paralysis, or cataplexy.  The Epworth score is 12 out of 24.  He gets seasonal allergies with Springtime being the worst season for him.  He was previously on allergy shots.  He does have more trouble with his sleep when his allergies are really bad.   Physical Exam:  General - pleasant Eyes - pupils reactive ENT - no sinus tenderness, no oral exudate, no LAN, over bite, MP 4, elongated uvula Cardiac - regular, no murmur Chest - no wheeze, rales Abd - soft, non tender Ext - no edema Skin - no rashes Neuro - normal strength Psych - normal mood  Discussion: He has snoring, sleep disruption, apnea,  and daytime sleepiness.  He has a history of atrial fibrillation.  I am concerned he could have sleep apnea.  We discussed how sleep apnea can affect various health problems, including risks for hypertension, cardiovascular disease, and diabetes.  We also discussed how sleep disruption can increase risks for accidents, such as while driving.  Weight loss as a means of improving sleep apnea was also reviewed.  Additional treatment options discussed were CPAP therapy, oral appliance, and surgical intervention.  Assessment/Plan:  Snoring. - will arrange for home sleep study to further assess for sleep apnea   Patient Instructions  Will arrange for home sleep study Will call to arrange for follow up after sleep study reviewed     Coralyn Helling, MD [facility] 10/16/2017, 9:19 AM  Flow Sheet  Sleep tests:  Cardiac tests: Echo 08/17/13 >> EF 60 to 65%, mild MR  Review of Systems: Constitutional: Negative for fever and unexpected weight change.  HENT: Positive for congestion, ear pain, sinus pressure and sneezing. Negative for dental problem, nosebleeds, postnasal drip, rhinorrhea, sore throat and trouble swallowing.   Eyes: Negative for redness and itching.  Respiratory: Negative for cough, chest tightness, shortness of breath and wheezing.   Cardiovascular: Positive for palpitations. Negative for leg  swelling.  Gastrointestinal: Negative for nausea and vomiting.  Genitourinary: Negative for dysuria.  Musculoskeletal: Negative for joint swelling.  Skin: Negative for rash.  Allergic/Immunologic: Positive for environmental allergies. Negative for food allergies and immunocompromised state.  Neurological: Positive for headaches.  Hematological: Bruises/bleeds easily.  Psychiatric/Behavioral: Negative for dysphoric mood. The patient is not nervous/anxious.    Past Medical History: He  has a past medical history of GERD, HYPERLIPIDEMIA, PAROXYSMAL ATRIAL FIBRILLATION,  and Syncope and collapse.  Past Surgical History: He  has a past surgical history that includes Tonsillectomy.  Family History: His family history includes Coronary artery disease in his unknown relative; Heart attack in his father.  Social History: He  reports that he has never smoked. He has never used smokeless tobacco. He reports that he does not drink alcohol or use drugs.  Medications: Allergies as of 10/16/2017      Reactions   Clindamycin Diarrhea, Nausea Only   Penicillins Other (See Comments)   Childhood  Other reaction(s):  Other Adverse Reaction (Add comment) Kidney problems      Medication List        Accurate as of 10/16/17  9:19 AM. Always use your most recent med list.          aspirin 81 MG tablet Take 81 mg by mouth daily.   atorvastatin 40 MG tablet Commonly known as:  LIPITOR TAKE ONE TABLET BY MOUTH ONCE DAILY   lansoprazole 15 MG capsule Commonly known as:  PREVACID Take 15 mg by mouth daily at 12 noon.   metoprolol succinate 25 MG 24 hr tablet Commonly known as:  TOPROL-XL TAKE ONE TABLET BY MOUTH ONCE DAILY

## 2017-10-16 NOTE — Patient Instructions (Signed)
Will arrange for home sleep study Will call to arrange for follow up after sleep study reviewed  

## 2017-10-27 ENCOUNTER — Ambulatory Visit (INDEPENDENT_AMBULATORY_CARE_PROVIDER_SITE_OTHER): Payer: Self-pay | Admitting: Family Medicine

## 2017-10-27 ENCOUNTER — Encounter (INDEPENDENT_AMBULATORY_CARE_PROVIDER_SITE_OTHER): Payer: Self-pay | Admitting: Family Medicine

## 2017-10-27 VITALS — BP 122/82 | HR 66 | Temp 97.8°F | Resp 18 | Ht 71.0 in | Wt 225.0 lb

## 2017-10-27 DIAGNOSIS — R202 Paresthesia of skin: Secondary | ICD-10-CM

## 2017-10-27 DIAGNOSIS — E7800 Pure hypercholesterolemia, unspecified: Secondary | ICD-10-CM

## 2017-10-27 NOTE — Progress Notes (Signed)
[facility]  [address]  [place] [address]        Encounter Date: 10/27/2017  1:30 PM EDT      Name: Shaun Lopez  Age: 57 y.o.  DOB: 03-Oct-1960  Sex: male    Chief Complaint:   Chief Complaint   Patient presents with   . Neck Pain   . Tingling In Arm       HPI  Patient here to evaluate some tingling and pain in his right upper extremity.  He reports that over the last couple weeks it has been more at night waking him up in the night he thought he was losing circulation to the right limb.  He actually went saw 1 of his cardiologist's to talk about the store and they did not think it was vascular at all.  He has history of carpal tunnel surgery on the left years ago.  He is right-hand dominant.  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  He does get intensification of symptoms when he is driving.  The only compounding part of his history taking is that he does have some history of some cervical disc disease and had some left-sided paresthesias that got better with hands-on therapy to his neck and traps.    History   Family Medical History:     Problem Relation (Age of Onset)    Arthritis-rheumatoid Mother    Cancer Sister    Colon Cancer Maternal Grandfather    Heart Attack Father    Hypertension (High Blood Pressure) Mother    Other Mother        Past Medical History:   Diagnosis Date   . Atrial fibrillation (CMS HCC)    . Cancer (CMS [place])     skin 2007   . Esophageal reflux    . High cholesterol    . Melanoma (CMS [place])     2007   . Varicella      Past Surgical History:   Procedure Laterality Date   . COLONOSCOPY     . HX OTHER      Melanoma removal   . HX TONSILLECTOMY     . HX WISDOM TEETH EXTRACTION       Patient Active Problem List    Diagnosis   . PAF (paroxysmal atrial fibrillation) (CMS HCC)   . GERD (gastroesophageal reflux disease)   . Pure hypercholesterolemia     Current Outpatient Medications   Medication Sig   .  aspirin (ECOTRIN) 81 mg Oral Tablet, Delayed Release (E.C.) Take 81 mg by mouth Once a day   . atorvastatin (LIPITOR) 40 mg Oral Tablet Take 40 mg by mouth Every evening   . LANSOPRAZOLE (PREVACID ORAL) Take 40 mg by mouth Once a day   . metoprolol succinate (TOPROL XL) 25 mg Oral Tablet Sustained Release 24 hr Take 25 mg by mouth Once a day     Social History     Tobacco Use   Smoking Status Never Smoker   Smokeless Tobacco Never Used     Social History     Substance and Sexual Activity   Alcohol Use No       Review of Systems   Constitutional: Negative for chills, fatigue and fever.   HENT: Negative for nosebleeds, sore throat and trouble swallowing.    Eyes: Negative for photophobia and pain.   Respiratory: Negative for cough  and wheezing.    Cardiovascular: Negative for chest pain and leg swelling.   Gastrointestinal: Negative for abdominal pain, constipation and diarrhea.   Endocrine: Negative for cold intolerance and heat intolerance.   Genitourinary: Negative for dysuria and urgency.   Musculoskeletal: Negative for back pain and joint swelling.   Skin: Negative for pallor.   Neurological: Positive for numbness. Negative for seizures, syncope and headaches.   Hematological: Negative for adenopathy. Does not bruise/bleed easily.   Psychiatric/Behavioral: Negative for confusion and hallucinations. The patient is not nervous/anxious.         Examination  Vitals: BP 122/82   Pulse 66   Temp 36.6 C (97.8 F)   Resp 18   Ht 1.803 m (5\' 11" )   Wt 102.1 kg (225 lb)   SpO2 97%   BMI 31.38 kg/m         Physical Exam   Constitutional: He is oriented to person, place, and time. He appears well-developed and well-nourished.   HENT:   Head: Normocephalic and atraumatic.   Eyes: Pupils are equal, round, and reactive to light. No scleral icterus.   Neck: Neck supple.   Cardiovascular: Normal rate and regular rhythm.   Pulmonary/Chest: Effort normal and breath sounds normal. He has no wheezes. He has no rales.      Abdominal: Soft. Bowel sounds are normal. There is no tenderness.   Musculoskeletal: He exhibits no edema.   With positive Phalen's and Tinel's over the right wrist median nerve site.  No loss of thenar eminence.  Grip strength equal bilaterally.   Lymphadenopathy:     He has no cervical adenopathy.   Neurological: He is alert and oriented to person, place, and time. No cranial nerve deficit.   Skin: Skin is warm. No rash noted.   Psychiatric: He has a normal mood and affect. His behavior is normal.       Assessment and Plan  Shaun Lopez was seen today for neck pain and tingling in arm.    Paresthesia of right upper extremity  -     Refer to Rodney Langton MD; Future    Pure hypercholesterolemia  -     CBC; Future  -     COMPREHENSIVE METABOLIC PNL, FASTING; Future  -     CREATINE KINASE (CK), TOTAL, SERUM; Future  -     LIPID PANEL; Future     Will refer to Neurology getting EMG.  His history localizes to the median nerve as well as his exam.  He will do some wrist bracing and NSAIDs in the meantime.  We did reassure him that the majority carpal tunnel can be managed with conservative measures but should he not respond will least have the EMG for future referral for possible evaluation for carpal tunnel procedure on the right.    Return as scheduled.    Hollace Kinnier, MD

## 2017-11-04 ENCOUNTER — Encounter (INDEPENDENT_AMBULATORY_CARE_PROVIDER_SITE_OTHER): Payer: Self-pay | Admitting: Family Medicine

## 2017-11-04 ENCOUNTER — Encounter: Payer: Self-pay | Admitting: Specialist

## 2017-11-04 DIAGNOSIS — R202 Paresthesia of skin: Secondary | ICD-10-CM

## 2017-11-05 ENCOUNTER — Other Ambulatory Visit: Payer: Self-pay | Admitting: Family Medicine

## 2017-11-21 ENCOUNTER — Telehealth (INDEPENDENT_AMBULATORY_CARE_PROVIDER_SITE_OTHER): Payer: Self-pay | Admitting: Family Medicine

## 2017-11-24 ENCOUNTER — Encounter (INDEPENDENT_AMBULATORY_CARE_PROVIDER_SITE_OTHER): Payer: Self-pay | Admitting: Family Medicine

## 2017-11-24 ENCOUNTER — Ambulatory Visit (INDEPENDENT_AMBULATORY_CARE_PROVIDER_SITE_OTHER): Payer: Self-pay | Admitting: Family Medicine

## 2017-11-24 VITALS — BP 120/74 | HR 61 | Temp 97.6°F | Resp 18 | Ht 71.0 in | Wt 217.0 lb

## 2017-11-24 DIAGNOSIS — S29019D Strain of muscle and tendon of unspecified wall of thorax, subsequent encounter: Secondary | ICD-10-CM

## 2017-11-24 MED ORDER — TIZANIDINE 2 MG TABLET: 2 mg | Tab | Freq: Three times a day (TID) | ORAL | 0 refills | 0 days | Status: AC

## 2017-11-24 NOTE — Progress Notes (Signed)
[facility]  [address]  [place] [address]        Encounter Date: 11/24/2017  9:00 AM EDT      Name: Shaun Lopez  Age: 57 y.o.  DOB: 01-03-1961  Sex: male    Chief Complaint:   Chief Complaint   Patient presents with   . Back Pain       HPI  Patient here for evaluation of some thoracic back pain.  He reports that his right upper extremity symptoms are getting better since the carpal tunnel was identified and he is wearing the brace at night.  He still works a physical job and has some discomforts after he cools down in the evening from his job.  He will also get if he works hard and sits for lunch and gets up and moves again.  His right along the right border of the scapula.  He was concerned that he could have some sort of longer kidney process involved but he has no hemoptysis no shortness of breath no hematuria or no change in urinary habit.  X-ray had a chest x-ray for cough back in April that showed good alignment of the T-spine.  It also had blood work in the interim that showed normal creatinine.    History   Family Medical History:     Problem Relation (Age of Onset)    Arthritis-rheumatoid Mother    Cancer Sister    Colon Cancer Maternal Grandfather    Heart Attack Father    Hypertension (High Blood Pressure) Mother    Other Mother        Past Medical History:   Diagnosis Date   . Atrial fibrillation (CMS HCC)    . Cancer (CMS [place] [facility])     skin 2007   . Esophageal reflux    . High cholesterol    . Melanoma (CMS [facility])     2007   . Varicella      Past Surgical History:   Procedure Laterality Date   . COLONOSCOPY     . HX OTHER      Melanoma removal   . HX TONSILLECTOMY     . HX WISDOM TEETH EXTRACTION       Patient Active Problem List    Diagnosis   . PAF (paroxysmal atrial fibrillation) (CMS HCC)   . GERD (gastroesophageal reflux disease)   . Pure hypercholesterolemia     Current Outpatient Medications   Medication Sig   . aspirin (ECOTRIN) 81 mg Oral Tablet, Delayed Release (E.C.) Take 81  mg by mouth Once a day   . atorvastatin (LIPITOR) 40 mg Oral Tablet Take 40 mg by mouth Every evening   . LANSOPRAZOLE (PREVACID ORAL) Take 40 mg by mouth Once a day   . metoprolol succinate (TOPROL XL) 25 mg Oral Tablet Sustained Release 24 hr Take 25 mg by mouth Once a day   . tiZANidine (ZANAFLEX) 2 mg Oral Tablet Take 1 Tab (2 mg total) by mouth Three times a day for 30 days     Social History     Tobacco Use   Smoking Status Never Smoker   Smokeless Tobacco Never Used     Social History     Substance and Sexual Activity   Alcohol Use No       Review of Systems   Constitutional: Negative for chills, fatigue and fever.   HENT: Negative for nosebleeds, sore throat and trouble swallowing.    Eyes:  Negative for photophobia and pain.   Respiratory: Negative for cough and wheezing.    Cardiovascular: Negative for chest pain and leg swelling.   Gastrointestinal: Negative for abdominal pain, constipation and diarrhea.   Endocrine: Negative for cold intolerance and heat intolerance.   Genitourinary: Negative for dysuria and urgency.   Musculoskeletal: Positive for back pain. Negative for joint swelling.   Neurological: Negative for seizures, syncope and headaches.   Hematological: Negative for adenopathy. Does not bruise/bleed easily.   Psychiatric/Behavioral: Negative for confusion and hallucinations. The patient is not nervous/anxious.         Examination  Vitals: BP 120/74   Pulse 61   Temp 36.4 C (97.6 F)   Resp 18   Ht 1.803 m (5\' 11" )   Wt 98.4 kg (217 lb)   SpO2 97%   BMI 30.27 kg/m         Physical Exam   Constitutional: He is oriented to person, place, and time. He appears well-developed and well-nourished.   HENT:   Head: Normocephalic and atraumatic.   Eyes: Pupils are equal, round, and reactive to light. No scleral icterus.   Neck: Neck supple.   Cardiovascular: Normal rate and regular rhythm.   Pulmonary/Chest: He has no wheezes. He has no rales.   Abdominal: Soft. Bowel sounds are normal. There is  no tenderness.   Musculoskeletal: He exhibits no edema.   He has reproducible tenderness and mild spasm along the thoracic paraspinals.  He has no rash present in the area.  No CVA tenderness or crepitus.   Lymphadenopathy:     He has no cervical adenopathy.   Neurological: He is alert and oriented to person, place, and time. No cranial nerve deficit.   Skin: Skin is warm. No rash noted.   Psychiatric: He has a normal mood and affect. His behavior is normal.       Assessment and Plan  Shaun Lopez was seen today for back pain.    Thoracic myofascial strain, subsequent encounter    Other orders  -     tiZANidine (ZANAFLEX) 2 mg Oral Tablet; Take 1 Tab (2 mg total) by mouth Three times a day for 30 days     Patient was reassured.  He was given a slip for physical therapy.  Will given some Zanaflex.  Was courage stay mobile and active.  He is reassured by today's discussion.  We talked about red flag signs of his pain which she has none of and will keep [place] abreast.    Return in about 3 months (around 02/24/2018).    Hollace Kinnier, MD

## 2017-12-31 DIAGNOSIS — G4733 Obstructive sleep apnea (adult) (pediatric): Secondary | ICD-10-CM | POA: Diagnosis not present

## 2018-01-02 ENCOUNTER — Encounter: Payer: Self-pay | Admitting: Pulmonary Disease

## 2018-01-02 ENCOUNTER — Telehealth: Payer: Self-pay | Admitting: Pulmonary Disease

## 2018-01-02 DIAGNOSIS — G4733 Obstructive sleep apnea (adult) (pediatric): Secondary | ICD-10-CM

## 2018-01-02 HISTORY — DX: Obstructive sleep apnea (adult) (pediatric): G47.33

## 2018-01-02 NOTE — Telephone Encounter (Signed)
HST 12/31/17 >> AHI 13.9, SaO2 low 87%   Will have my nurse inform pt that sleep study shows mild sleep apnea.  Options are 1) CPAP now, 2) ROV first.  If He is agreeable to CPAP, then please send order for auto CPAP range 5 to 15 cm H2O with heated humidity and mask of choice.  Have download sent 1 month after starting CPAP and set up ROV 2 months after starting CPAP.  ROV can be with me or NP.

## 2018-01-02 NOTE — Telephone Encounter (Signed)
Called and spoke with patient regarding results.  Informed the patient of results and recommendations today. Pt verbalized understanding and denied any questions or concerns at this time.  Pt is agreeable to CPAP,placed order for auto CPAP range 5 to 15 cm H2O with heated humidity and mask choice. Have download sent 1 month after starting CPAP and set up ROV 2 months after starting CPAP. Nothing further needed.

## 2018-01-05 ENCOUNTER — Other Ambulatory Visit: Payer: Self-pay | Admitting: *Deleted

## 2018-01-05 DIAGNOSIS — R0683 Snoring: Secondary | ICD-10-CM

## 2018-01-07 ENCOUNTER — Telehealth: Payer: Self-pay | Admitting: Pulmonary Disease

## 2018-01-07 NOTE — Telephone Encounter (Signed)
Spoke with the pt  He states that his Father and Brother both have OSA  They were advised that they had around 100 AHI per hour on their sleep study  He has only 13.9 per hour  He wants to know why there is such a difference  I advised that every individuals test will be different, and based on the information given,  sounds like his family has severe apnea, where he only has mild  He wants to know how many events per hour he would have to have to have severe OSA  Please advise thanks

## 2018-01-08 NOTE — Telephone Encounter (Signed)
The difference in severity of sleep apnea could be related to differences in weight, upper airway anatomy, age, coexisting health problems, position of sleep during sleep testing, whether various stages of sleep were achieved during sleep study, and medications used.  Regardless of the severity of sleep apnea, the treatment options are still the same.

## 2018-01-08 NOTE — Telephone Encounter (Signed)
Spoke with pt and advised on information from Dr Craige CottaSood regarding sleep apnea testing and results. PT verbalized understanding.  Nothing further needed.

## 2018-01-14 ENCOUNTER — Telehealth: Payer: Self-pay | Admitting: Cardiology

## 2018-01-14 ENCOUNTER — Other Ambulatory Visit: Payer: Self-pay

## 2018-01-14 ENCOUNTER — Other Ambulatory Visit: Payer: Self-pay | Admitting: Cardiology

## 2018-01-14 MED ORDER — METOPROLOL SUCCINATE ER 25 MG PO TB24: 25.0000 mg | ORAL_TABLET | Freq: Every day | ORAL | 2 refills | Status: DC

## 2018-01-14 MED ORDER — ATORVASTATIN CALCIUM 40 MG PO TABS: 40.0000 mg | ORAL_TABLET | Freq: Every day | ORAL | 2 refills | Status: DC

## 2018-01-14 NOTE — Telephone Encounter (Signed)
rx submitted.  

## 2018-01-14 NOTE — Telephone Encounter (Signed)
New Message           *STAT* If patient is at the pharmacy, call can be transferred to refill team.   1. Which medications need to be refilled? (please list name of each medication and dose if known) Lipitor   2. Which pharmacy/location (including street and city if local pharmacy) is medication to be sent to? [facility] [phone]  3. Do they need a 30 day or 90 day supply? 90  Patient was suppose to get 2 Rx but only recv'd one. Would like the other one as soon as possible.

## 2018-01-14 NOTE — Telephone Encounter (Signed)
°*  STAT* If patient is at the pharmacy, call can be transferred to refill team.   1. Which medications need to be refilled? (please list name of each medication and dose if known) Atorvastatin 40 mg Metoprolol Succinate  2. Which pharmacy/location (including street and city if local pharmacy) is medication to be sent to?[facility] [place]   [phone] 3. Do they need a 30 day or 90 day supply? 90 Day

## 2018-02-20 ENCOUNTER — Ambulatory Visit (INDEPENDENT_AMBULATORY_CARE_PROVIDER_SITE_OTHER): Payer: Self-pay | Admitting: Family Medicine

## 2018-02-20 ENCOUNTER — Encounter (INDEPENDENT_AMBULATORY_CARE_PROVIDER_SITE_OTHER): Payer: Self-pay | Admitting: Family Medicine

## 2018-02-20 VITALS — BP 118/64 | HR 64 | Temp 97.9°F | Resp 18 | Ht 71.0 in | Wt 216.0 lb

## 2018-02-20 DIAGNOSIS — E7800 Pure hypercholesterolemia, unspecified: Secondary | ICD-10-CM

## 2018-02-20 DIAGNOSIS — I480 Paroxysmal atrial fibrillation: Secondary | ICD-10-CM

## 2018-02-20 DIAGNOSIS — G4733 Obstructive sleep apnea (adult) (pediatric): Secondary | ICD-10-CM | POA: Insufficient documentation

## 2018-02-20 NOTE — Progress Notes (Signed)
[facility]  [place] [address]        Encounter Date: 02/20/2018  3:00 PM EDT      Name: Shaun Lopez  Age: 57 y.o.  DOB: 1960-11-01  Sex: male    Chief Complaint:   Chief Complaint   Patient presents with   . Follow Up 3 Months       HPI  Patient here for follow-up on his lumbar strain his paroxysmal atrial fibrillation.  He reports that he has had some updated heart testing with his cardiologist in his echo showed preserved ejection fraction.  He was also identified to have some sleep apnea by pulmonology.  He was having some low energy and a lot of it is just from him not reach charging well he has not yet used to the pressures of that has not been able to use it consistently.      History   Family Medical History:     Problem Relation (Age of Onset)    Arthritis-rheumatoid Mother    Cancer Sister    Colon Cancer Maternal Grandfather    Heart Attack Father    Hypertension (High Blood Pressure) Mother    Other Mother        Past Medical History:   Diagnosis Date   . Atrial fibrillation (CMS HCC)    . Cancer (CMS [facility])     skin 2007   . Esophageal reflux    . High cholesterol    . Melanoma (CMS [place])     2007   . Varicella      Past Surgical History:   Procedure Laterality Date   . COLONOSCOPY     . HX OTHER      Melanoma removal   . HX TONSILLECTOMY     . HX WISDOM TEETH EXTRACTION       Patient Active Problem List    Diagnosis   . OSA (obstructive sleep apnea)   . PAF (paroxysmal atrial fibrillation) (CMS HCC)   . GERD (gastroesophageal reflux disease)   . Pure hypercholesterolemia     Current Outpatient Medications   Medication Sig   . aspirin (ECOTRIN) 81 mg Oral Tablet, Delayed Release (E.C.) Take 81 mg by mouth Once a day   . atorvastatin (LIPITOR) 40 mg Oral Tablet Take 40 mg by mouth Every evening   . LANSOPRAZOLE (PREVACID ORAL) Take 40 mg by mouth Once a day   . metoprolol succinate (TOPROL XL) 25 mg Oral Tablet Sustained Release 24 hr Take 25 mg by mouth Once a day          Social History     Tobacco Use   Smoking Status Never Smoker   Smokeless Tobacco Never Used     Social History     Substance and Sexual Activity   Alcohol Use No       Review of Systems   Constitutional: Negative for chills, fatigue and fever.   HENT: Negative for nosebleeds, sore throat and trouble swallowing.    Eyes: Negative for photophobia and pain.   Respiratory: Negative for cough and wheezing.    Cardiovascular: Negative for chest pain and leg swelling.   Gastrointestinal: Negative for abdominal pain, constipation and diarrhea.   Endocrine: Negative for cold intolerance and heat intolerance.   Genitourinary: Negative for dysuria and urgency.   Musculoskeletal: Negative for back pain and joint swelling.   Neurological: Negative for seizures, syncope and headaches.   Hematological:  Negative for adenopathy. Does not bruise/bleed easily.   Psychiatric/Behavioral: Negative for confusion and hallucinations. The patient is not nervous/anxious.         Examination  Vitals: BP 118/64   Pulse 64   Temp 36.6 C (97.9 F)   Resp 18   Ht 1.803 m (5\' 11" )   Wt 98 kg (216 lb)   SpO2 96%   BMI 30.13 kg/m         Physical Exam   Constitutional: He is oriented to person, place, and time. He appears well-developed and well-nourished.   HENT:   Head: Normocephalic and atraumatic.   Eyes: Pupils are equal, round, and reactive to light. No scleral icterus.   Neck: Neck supple.   Cardiovascular: Normal rate and regular rhythm.   Pulmonary/Chest: Effort normal and breath sounds normal. He has no wheezes. He has no rales.   Abdominal: Soft. Bowel sounds are normal. There is no tenderness.   Musculoskeletal: He exhibits no edema.   Lymphadenopathy:     He has no cervical adenopathy.   Neurological: He is alert and oriented to person, place, and time. No cranial nerve deficit.   Skin: Skin is warm. No rash noted.   Psychiatric: He has a normal mood and affect. His behavior is normal.       Assessment and Plan  Shaun Lopez was  seen today for follow up 3 months.    PAF (paroxysmal atrial fibrillation) (CMS HCC)    Pure hypercholesterolemia    OSA (obstructive sleep apnea)     Will continues current medical treatment he is going to try a mouth guard.  Follow-up in 6 months or p.r.n.Marland Kitchen    Return in about 6 months (around 08/21/2018).    Hollace Kinnier, MD

## 2018-02-24 ENCOUNTER — Encounter (INDEPENDENT_AMBULATORY_CARE_PROVIDER_SITE_OTHER): Payer: Self-pay | Admitting: Family Medicine

## 2018-03-06 ENCOUNTER — Ambulatory Visit: Payer: BLUE CROSS/BLUE SHIELD | Admitting: Pulmonary Disease

## 2018-03-27 ENCOUNTER — Ambulatory Visit: Payer: BLUE CROSS/BLUE SHIELD | Admitting: Pulmonary Disease

## 2018-04-14 ENCOUNTER — Ambulatory Visit: Payer: BLUE CROSS/BLUE SHIELD | Admitting: Pulmonary Disease

## 2018-07-12 ENCOUNTER — Emergency Department
Admission: EM | Admit: 2018-07-12 | Discharge: 2018-07-12 | Disposition: A | Discharge status: Home / Self Care | Payer: BC Managed Care – PPO | Attending: Emergency Medicine | Admitting: Emergency Medicine

## 2018-07-12 ENCOUNTER — Other Ambulatory Visit: Payer: Self-pay

## 2018-07-12 ENCOUNTER — Encounter (HOSPITAL_COMMUNITY): Payer: Self-pay

## 2018-07-12 DIAGNOSIS — I444 Left anterior fascicular block: Secondary | ICD-10-CM

## 2018-07-12 DIAGNOSIS — D72829 Elevated white blood cell count, unspecified: Secondary | ICD-10-CM | POA: Insufficient documentation

## 2018-07-12 DIAGNOSIS — Z7982 Long term (current) use of aspirin: Secondary | ICD-10-CM | POA: Insufficient documentation

## 2018-07-12 DIAGNOSIS — Z5181 Encounter for therapeutic drug level monitoring: Secondary | ICD-10-CM | POA: Insufficient documentation

## 2018-07-12 DIAGNOSIS — R002 Palpitations: Secondary | ICD-10-CM | POA: Insufficient documentation

## 2018-07-12 DIAGNOSIS — I4891 Unspecified atrial fibrillation: Secondary | ICD-10-CM | POA: Insufficient documentation

## 2018-07-12 DIAGNOSIS — R9431 Abnormal electrocardiogram [ECG] [EKG]: Secondary | ICD-10-CM | POA: Insufficient documentation

## 2018-07-12 LAB — BASIC METABOLIC PANEL
ANION GAP: 15 mmol/L
ANION GAP: 15 mmol/L
BUN/CREA RATIO: 16
BUN: 18 mg/dL (ref 10–25)
CALCIUM: 9.7 mg/dL (ref 8.8–10.3)
CHLORIDE: 105 mmol/L (ref 98–111)
CO2 TOTAL: 18 mmol/L — ABNORMAL LOW (ref 21–35)
CREATININE: 1.13 mg/dL (ref <=1.30)
ESTIMATED GFR: >60 mL/min/{1.73_m2}
GLUCOSE: 111 mg/dL — ABNORMAL HIGH (ref 70–110)
POTASSIUM: 4.2 mmol/L (ref 3.5–5.0)
SODIUM: 138 mmol/L (ref 135–145)

## 2018-07-12 LAB — CBC WITH DIFF
BASOPHIL #: 0.1 x10ˆ3/uL (ref 0.00–0.20)
BASOPHIL %: 1 %
EOSINOPHIL #: 0.3 x10ˆ3/uL (ref 0.00–0.50)
EOSINOPHIL %: 3 %
HCT: 47.7 % (ref 38.9–50.5)
HGB: 16.6 g/dL (ref 13.4–17.3)
LYMPHOCYTE #: 3.4 x10ˆ3/uL — ABNORMAL HIGH (ref 0.80–3.20)
LYMPHOCYTE %: 32 %
MCH: 30 pg (ref 27.9–33.1)
MCHC: 34.8 g/dL (ref 32.8–36.0)
MCV: 86.2 fL (ref 82.4–95.0)
MONOCYTE #: 0.9 x10ˆ3/uL — ABNORMAL HIGH (ref 0.20–0.80)
MONOCYTE %: 8 %
MPV: 8.5 fL (ref 6.0–10.2)
NEUTROPHIL #: 6.3 x10ˆ3/uL — ABNORMAL HIGH (ref 1.60–5.50)
NEUTROPHIL %: 57 %
PLATELETS: 234 x10ˆ3/uL (ref 140–440)
RBC: 5.54 x10ˆ6/uL (ref 4.40–5.68)
RDW: 13.4 % (ref 10.9–15.1)
RDW: 13.4 % (ref 10.9–15.1)
WBC: 11 x10ˆ3/uL — ABNORMAL HIGH (ref 3.3–9.3)

## 2018-07-12 LAB — THYROID STIMULATING HORMONE WITH FREE T4 REFLEX: TSH: 3.748 u[IU]/mL (ref 0.450–5.330)

## 2018-07-12 LAB — PT/INR: INR: 0.96 (ref 0.80–1.10)

## 2018-07-12 LAB — TROPONIN-I: TROPONIN I: 0 ng/mL (ref <=0.04)

## 2018-07-12 NOTE — ED Provider Notes (Signed)
[facility]  Emergency Department  Attending Note    Identification:  Name: Shaun Lopez  Age and Gender: 58 y.o. male  Date of Birth: 1961/06/01  Date of Service: 07/12/2018  MRN: [id number]  PCP: Hollace Kinnier, MD    Code Status: full    Arrival: The patient arrived by private car and is alone.    History Obtained from: Patient. EMR.     History Limitations: none    CC:  Chief Complaint   Patient presents with   . Palpitations     pt c/o's heart beating like a-fib again since yesterday morning. hx a-fib.      HPI:  Shaun Lopez is a 58 y.o. White male presenting with palpitations.  Onset this morning at rest.  Patient states that he believes he is in atrial fibrillation.  Patient does have history of paroxysmal atrial fibrillation of follows with cardiologist where he lives in [place].  Patient denies any other accompanying symptoms.  No fever, chills, chest pain, dyspnea, abdominal pain, nausea, vomiting, urinary symptoms, focal deficits.  Past medical history significant for atrial fibrillation, hyperlipidemia, GERD.  Patient on aspirin.  No other anticoagulation.  No alcohol, tobacco, illicit drugs.    ROS:  Constitutional: - fever, - chills, - weakness   Skin: - rash, - diaphoresis  HENT: - headaches, - congestion  Eyes: - vision changes   Cardiovascular: - chest pain, + palpitations, - edema  Respiratory: - cough, - wheezing, - SOB  GI: - nausea, - vomiting, - diarrhea, - constipation, - abdominal pain  GU: - dysuria, - hematuria, - polyuria  MSK: - joint pain, - back pain  Neuro: - loss of sensation, - confusion, - focal deficits, - paresthesias  Psychiatric: - mood changes. - SI/HI/AVH.   All other systems reviewed and are negative.    Medications:  Medications reviewed with patient/patient's family.   Cannot display prior to admission medications because the patient has not been admitted in this contact.          Allergies:  Allergies reviewed with patient/patient's family.  Allergies      Allergen Reactions   . Penicillins  Other Adverse Reaction (Add comment)     Kidney problems     . Clindamycin Diarrhea       PMSFSH:  Past medical, surgical, family and social history reviewed with patient/patient's family.  Past Medical History:   Diagnosis Date   . Atrial fibrillation (CMS HCC)    . Cancer (CMS [place])     skin 2007   . Esophageal reflux    . High cholesterol    . Melanoma (CMS [facility])     2007   . Varicella      Past Surgical History:   Procedure Laterality Date   . Colonoscopy     . Hx other     . Hx tonsillectomy     . Hx wisdom teeth extraction       Family History   Problem Relation Age of Onset   . Other Mother         SPINAL STENOSIS-DDD   . Arthritis-rheumatoid Mother    . Hypertension (High Blood Pressure) Mother    . Heart Attack Father    . Cancer Sister         TUMOR ON UTERUS OR OVARIES   . Colon Cancer Maternal Grandfather      Social History     Socioeconomic History   .  Marital status: Single     Spouse name: Not on file   . Number of children: Not on file   . Years of education: Not on file   . Highest education level: Not on file   Occupational History   . Not on file   Social Needs   . Financial resource strain: Not on file   . Food insecurity:     Worry: Not on file     Inability: Not on file   . Transportation needs:     Medical: Not on file     Non-medical: Not on file   Tobacco Use   . Smoking status: Never Smoker   . Smokeless tobacco: Never Used   Substance and Sexual Activity   . Alcohol use: No   . Drug use: No   . Sexual activity: Not on file   Lifestyle   . Physical activity:     Days per week: Not on file     Minutes per session: Not on file   . Stress: Not on file   Relationships   . Social connections:     Talks on phone: Not on file     Gets together: Not on file     Attends religious service: Not on file     Active member of club or organization: Not on file     Attends meetings of clubs or organizations: Not on file     Relationship status: Not on file   . Intimate  partner violence:     Fear of current or ex partner: Not on file     Emotionally abused: Not on file     Physically abused: Not on file     Forced sexual activity: Not on file   Other Topics Concern   . Uses Cane Not Asked   . Uses walker Not Asked   . Uses wheelchair Not Asked   . Right hand dominant Yes   . Left hand dominant Not Asked   . Ambidextrous Not Asked   . Shift Work Not Asked   . Unusual Sleep-Wake Schedule Not Asked   Social History Narrative   . Not on file       Pertinent Physical Exam:   All nurse's notes reviewed.  BP 111/87   Pulse 86   Temp 36 C (96.8 F)   Resp 18   Ht 1.803 m (_0 )   Wt 90.7 kg (200 lb)   SpO2 96%   BMI 27.89 kg/m   Constitutional: NAD. A&Ox3. Good historian.   Head: NC/AT. MMM. PERRLA. EOMI.  Heart:  Irregular but rate controlled. No m/r/g.  Lungs: CTAB. No distress.  Abdomen: +BS. Soft. NT/ND.   Musculoskeletal: No deformity or edema.  Skin: Warm and dry. No rash, erythema, jaundice, cyanosis.  Neurologic: CNs grossly intact. Neurovascularly intact bilaterally. No focal neurological deficits.  Psychiatric: Normal affect and mood.    Orders:  Results up to the Time the Disposition was Entered   BASIC METABOLIC PANEL - Abnormal; Notable for the following components:       Result Value    CO2 TOTAL 18 (*)     GLUCOSE 111 (*)     All other components within normal limits    Narrative:     Estimated Glomerular Filtration Rate (eGFR) calculated using the CKD-EPI (2009) equation, intended for patients 9 years of age and older. If race and/or gender is not documented or "unknown," there will be no eGFR  calculation.   CBC WITH DIFF - Abnormal; Notable for the following components:    WBC 11.0 (*)     NEUTROPHIL # 6.30 (*)     LYMPHOCYTE # 3.40 (*)     MONOCYTE # 0.90 (*)     All other components within normal limits   PT/INR - Normal    Narrative:     Coumadin Therapy INR range for Conventional Anticoagulation Therapy is 2.0 to 3.0 and for Intensive Anticoagulation  Therapy 2.5 to 3.5   TROPONIN-I - Normal   THYROID STIMULATING HORMONE WITH FREE T4 REFLEX - Normal   ECG 12 LEAD - ED USE    Narrative:     -------------------------ECG Interpretation-----------------------------------  Atrial fibrillation  Left anterior fascicular block  .  Marland Kitchen  MD Signature: Unconfirmed Diagnosis   CBC/DIFF    Narrative:     The following orders were created for panel order CBC/DIFF.  Procedure                               Abnormality         Status                     ---------                               -----------         ------                     CBC WITH TWSF[[id number]]                Abnormal            Final result                 Please view results for these tests on the individual orders.   DIET NPO NOW   CONTINUOUS CARDIAC MONITORING (ED USE ONLY)   PULSE OXIMETRY       EKG:  Atrial fibrillation with left anterior fascicular block.  Left axis.  Normal QTC interval.  Abnormal R-wave progression with late transition.  No ST or T-wave changes concerning for ischemia or infarction.  No previous EKG.    Course:   Patient seen and examined.  Vital signs stable and within normal limits.  Physical exam as above.  EKG as above.  Baseline lab work shows very minimal leukocytosis.  Results were discussed with patient.  He was given the opportunity to ask questions.  Had lengthy discussion patient about supportive care return to emergency department.  Chads Vasc 0. Recommended full-strength aspirin daily.  Patient discharged home with planned follow up primary care physician within the next 2-3 days and with cardiologist as indicated.  All questions answered.  Patient agreeable to plan.    Consults:   None     Impression:   Encounter Diagnoses   Name Primary?   . Palpitations    . Leukocytosis, unspecified type    . Atrial fibrillation (CMS HCC) Yes       Disposition:    Discharged. Patient/patient's family was advised to return to the ED with any new, concerning or worsening symptoms and  follow up as directed. The patient/patient's family verbalized understanding of all instructions and had no further questions or concerns.     Ellan Lambert, MD  07/12/2018, 04:15   [facility] Department of Emergency Medicine    Parts of this patient's chart was completed in a retrospective fashion due to simultaneous direct patient care in the Emergency Department.   This note was partially generated using MModal Fluency Direct system, and there may be some incorrect words, spelling, and punctuation that were not noted before saving.

## 2018-07-12 NOTE — Discharge Instructions (Signed)
Informational handouts given on discharge.  Resume home diet, as tolerated.   Gradually increase activity, as tolerated.  Return to the ED with any new, concerning or worsening symptoms and follow up as directed.    Prescriptions:   New Prescriptions    No medications on file   Recommend full-strength aspirin daily.    Follow Up:    [facility] - Emergency Department  [place] [address]  [phone]    As needed    Hollace Kinnier, [place] [address]  [phone]    Call       No future appointments.

## 2018-07-13 LAB — ECG 12 LEAD - ED USE
Calculated P Axis: INVALID deg
Calculated T Axis: 45 deg
EKG Severity: ABNORMAL
Heart Rate: 83 {beats}/min
I 40 Axis: 21 deg
PR Interval: INVALID ms
QRS Axis: -42 deg
QRS Duration: 91 ms
QT Interval: 363 ms
QTC Calculation: 427 ms
ST Axis: -7 deg
T 40 Axis: -53 deg

## 2018-07-16 ENCOUNTER — Ambulatory Visit (INDEPENDENT_AMBULATORY_CARE_PROVIDER_SITE_OTHER): Payer: Self-pay | Admitting: Family Medicine

## 2018-07-16 ENCOUNTER — Encounter (INDEPENDENT_AMBULATORY_CARE_PROVIDER_SITE_OTHER): Payer: Self-pay | Admitting: Family Medicine

## 2018-07-16 ENCOUNTER — Telehealth (INDEPENDENT_AMBULATORY_CARE_PROVIDER_SITE_OTHER): Payer: Self-pay | Admitting: Family Medicine

## 2018-07-16 VITALS — BP 120/72 | HR 60 | Temp 97.4°F | Resp 18 | Ht 71.0 in | Wt 221.0 lb

## 2018-07-16 DIAGNOSIS — S29011A Strain of muscle and tendon of front wall of thorax, initial encounter: Secondary | ICD-10-CM

## 2018-07-16 DIAGNOSIS — I480 Paroxysmal atrial fibrillation: Secondary | ICD-10-CM

## 2018-07-16 MED ORDER — METOPROLOL SUCCINATE ER 50 MG TABLET,EXTENDED RELEASE 24 HR
50.0000 mg | ORAL_TABLET | Freq: Every day | ORAL | 1 refills | Status: DC
Start: 2018-07-16 — End: 2019-03-16

## 2018-07-16 NOTE — Telephone Encounter (Signed)
Had cx and pt will be scheduled today

## 2018-07-16 NOTE — Telephone Encounter (Signed)
Pt calling & he called yesterday to make appt with Dr Mena Goes & he was scheduled & added to the wait list but he ended up at the Er last night & they want him to follow up with Dr Mena Goes. He is in town today & tom then has to go back to [place] for 2 weeks. He is hoping he can be worked in somewhere over the next 2 days? He is scheduled Feb 18.           [phone]

## 2018-07-16 NOTE — Progress Notes (Signed)
[facility]  [address]  [place] [id number]        Encounter Date: 07/16/2018 11:00 AM EST      Name: Shaun Lopez  Age: 58 y.o.  DOB: 24-Jun-1960  Sex: male    Chief Complaint:   Chief Complaint   Patient presents with   . ED Follow-up       HPI  Patient here for follow-up after an ER stay for a bout of atrial fibrillation.  He has known history of paroxysmal atrial fibrillation and been in sinus for sometimes on 25 mg of metoprolol.  He was on flecainide in the distant past.  He reports that he could tell he had a low bit of a pressure in his chest his pulse at his chest felt a little more thready and he felt a little short of breath.  When he could tell he is in the atrial fibrillation at last little while he tried resting in using will biofeedback as he has in the past successfully and it did not abate.  He went to the emergency department there had unremarkable workup with a normal TSH as well as EKG that did demonstrate atrial fibrillation but with rate control.  He has chads Vasc score of 0 therefore they just recommended maybe taking a full-strength aspirin.  He reports that sometime the next day he converted back into sinus rhythm.  Since then he has not had any other changes.  He still does drink tea although not as much as he did in the past and he has been resting reasonably well lately.    History   Family Medical History:     Problem Relation (Age of Onset)    Arthritis-rheumatoid Mother    Cancer Sister    Colon Cancer Maternal Grandfather    Heart Attack Father    Hypertension (High Blood Pressure) Mother    Other Mother        Past Medical History:   Diagnosis Date   . Atrial fibrillation (CMS HCC)    . Cancer (CMS [facility])     skin 2007   . Esophageal reflux    . High cholesterol    . Melanoma (CMS [facility])     2007   . Varicella      Past Surgical History:   Procedure Laterality Date   . COLONOSCOPY     . HX OTHER      Melanoma removal   . HX TONSILLECTOMY     . HX WISDOM TEETH  EXTRACTION       Patient Active Problem List    Diagnosis   . OSA (obstructive sleep apnea)   . PAF (paroxysmal atrial fibrillation) (CMS HCC)   . GERD (gastroesophageal reflux disease)   . Pure hypercholesterolemia     Current Outpatient Medications   Medication Sig   . aspirin (ECOTRIN) 81 mg Oral Tablet, Delayed Release (E.C.) Take 81 mg by mouth Once a day   . atorvastatin (LIPITOR) 40 mg Oral Tablet Take 40 mg by mouth Every evening   . LANSOPRAZOLE (PREVACID ORAL) Take 40 mg by mouth Once a day   . metoprolol succinate (TOPROL-XL) 50 mg Oral Tablet Sustained Release 24 hr Take 1 Tab (50 mg total) by mouth Once a day for 180 days     Social History     Tobacco Use   Smoking Status Never Smoker   Smokeless Tobacco Never Used     Social History  Substance and Sexual Activity   Alcohol Use No       Review of Systems   Constitutional: Negative for chills, fatigue and fever.   HENT: Negative for nosebleeds, sore throat and trouble swallowing.    Eyes: Negative for photophobia and pain.   Respiratory: Negative for cough and wheezing.    Cardiovascular: Negative for chest pain and leg swelling.   Gastrointestinal: Negative for abdominal pain, constipation and diarrhea.   Endocrine: Negative for cold intolerance and heat intolerance.   Genitourinary: Negative for dysuria and urgency.   Musculoskeletal: Negative for back pain and joint swelling.   Neurological: Negative for seizures, syncope and headaches.   Hematological: Negative for adenopathy. Does not bruise/bleed easily.   Psychiatric/Behavioral: Negative for confusion and hallucinations. The patient is not nervous/anxious.         Examination  Vitals: BP 120/72   Pulse 60   Temp 36.3 C (97.4 F)   Resp 18   Ht 1.803 m (5\' 11" )   Wt 100.2 kg (221 lb)   SpO2 95%   BMI 30.82 kg/m         Physical Exam  Constitutional:       Appearance: He is well-developed.   HENT:      Head: Normocephalic and atraumatic.      Right Ear: Tympanic membrane normal.       Left Ear: Tympanic membrane normal.   Eyes:      General: No scleral icterus.     Pupils: Pupils are equal, round, and reactive to light.   Neck:      Musculoskeletal: Neck supple.   Cardiovascular:      Rate and Rhythm: Normal rate and regular rhythm.      Pulses: Normal pulses.      Heart sounds: Normal heart sounds.   Pulmonary:      Effort: Pulmonary effort is normal.      Breath sounds: Normal breath sounds. No wheezing or rales.   Abdominal:      General: Bowel sounds are normal.      Palpations: Abdomen is soft.      Tenderness: There is no abdominal tenderness.   Musculoskeletal:      Right lower leg: No edema.      Left lower leg: No edema.   Lymphadenopathy:      Cervical: No cervical adenopathy.   Skin:     General: Skin is warm.      Findings: No rash.   Neurological:      Mental Status: He is alert and oriented to person, place, and time.      Cranial Nerves: No cranial nerve deficit.   Psychiatric:         Mood and Affect: Mood normal.         Behavior: Behavior normal.         Assessment and Plan  Solan was seen today for ed follow-up.    PAF (paroxysmal atrial fibrillation) (CMS HCC)    Pectoralis muscle rupture    Other orders  -     metoprolol succinate (TOPROL-XL) 50 mg Oral Tablet Sustained Release 24 hr; Take 1 Tab (50 mg total) by mouth Once a day for 180 days     Because the patient has no evidence of any sick sinus syndrome he should tolerate titration of his metoprolol for better beta-blockade.  Will titrate him up to 50 mg explain to him this isn't necessarily preventative of the atrial fibrillation but would  keep him from being his symptomatic if he flipped back.  He has had his cardiology appointment moved up a little bit will be seeing them next month they can assess how he tolerates the titrated dose.  We did talk about getting adequate rest avoiding caffeine more and staying physically active.  On a side note he does seem to have a pectoralis minor rupture on the left side without other  ominous finding will monitor this over time.    Return in about 3 months (around 10/15/2018).    Hollace Kinnier, MD

## 2018-07-20 NOTE — Progress Notes (Signed)
HPI: FUparoxysmal atrial fibrillation. Note he had a prolonged vagal episode following an exercise treadmill requiring transient CPR. Echo 3/15 shows normal LV function, mild AI, mild MR, mild RVE, mild TR. Since I last saw him,approximately 3 weeks ago the patient had an episode of atrial fibrillation.  He states he developed palpitations that persisted throughout the day.  He was seen in [place] and apparently was in atrial fibrillation but I do not have the electrocardiogram available.  He was subsequently seen by his primary care physician and Toprol was increased.  He has not had recurrent atrial fibrillation since that 24-hour episode.  He denies dyspnea on exertion, orthopnea, PND, pedal edema, syncope or exertional chest pain.  Current Outpatient Medications  Medication Sig Dispense Refill  . aspirin 81 MG tablet Take 81 mg by mouth daily.    Marland Kitchen atorvastatin (LIPITOR) 40 MG tablet Take 1 tablet (40 mg total) by mouth daily. 90 tablet 2  . lansoprazole (PREVACID) 15 MG capsule Take 15 mg by mouth daily at 12 noon.    . metoprolol succinate (TOPROL-XL) 25 MG 24 hr tablet Take 1 tablet (25 mg total) by mouth daily. (Patient taking differently: Take 50 mg by mouth daily. ) 90 tablet 2   No current facility-administered medications for this visit.      Past Medical History:  Diagnosis Date  . GERD   . HYPERLIPIDEMIA   . OSA (obstructive sleep apnea) 01/02/2018  . PAROXYSMAL ATRIAL FIBRILLATION   . Syncope and collapse     Past Surgical History:  Procedure Laterality Date  . TONSILLECTOMY      Social History   Socioeconomic History  . Marital status: Single    Spouse name: Not on file  . Number of children: Not on file  . Years of education: Not on file  . Highest education level: Not on file  Occupational History  . Not on file  Social Needs  . Financial resource strain: Not on file  . Food insecurity:    Worry: Not on file    Inability: Not on file  .  Transportation needs:    Medical: Not on file    Non-medical: Not on file  Tobacco Use  . Smoking status: Never Smoker  . Smokeless tobacco: Never Used  Substance and Sexual Activity  . Alcohol use: No  . Drug use: No  . Sexual activity: Not on file  Lifestyle  . Physical activity:    Days per week: Not on file    Minutes per session: Not on file  . Stress: Not on file  Relationships  . Social connections:    Talks on phone: Not on file    Gets together: Not on file    Attends religious service: Not on file    Active member of club or organization: Not on file    Attends meetings of clubs or organizations: Not on file    Relationship status: Not on file  . Intimate partner violence:    Fear of current or ex partner: Not on file    Emotionally abused: Not on file    Physically abused: Not on file    Forced sexual activity: Not on file  Other Topics Concern  . Not on file  Social History Narrative  . Not on file    Family History  Problem Relation Age of Onset  . Heart attack Father   . Coronary artery disease Other     ROS:  no fevers or chills, productive cough, hemoptysis, dysphasia, odynophagia, melena, hematochezia, dysuria, hematuria, rash, seizure activity, orthopnea, PND, pedal edema, claudication. Remaining systems are negative.  Physical Exam: Well-developed well-nourished in no acute distress.  Skin is warm and dry.  HEENT is normal.  Neck is supple.  Chest is clear to auscultation with normal expansion.  Cardiovascular exam is regular rate and rhythm.  Abdominal exam nontender or distended. No masses palpated. Extremities show no edema. neuro grossly intact  ECG-sinus bradycardia at a rate of 53, left axis deviation, no ST changes.  Personally reviewed  A/P  1 paroxysmal atrial fibrillation-patient had an episode of atrial fibrillation that lasted approximately 24 hours several weeks ago.  He is in sinus today.  His Toprol was increased and we will  continue 50 mg daily.  Repeat echocardiogram.  His TSH was normal.  If he has more frequent episodes in the future we will consider an antiarrhythmic such as flecainide versus referral for ablation. CHADSvasc 0.  Continue aspirin 81 mg daily.  2 hyperlipidemia-continue statin.   Olga Millers, MD

## 2018-07-29 ENCOUNTER — Encounter: Payer: Self-pay | Admitting: Cardiology

## 2018-07-29 ENCOUNTER — Ambulatory Visit (INDEPENDENT_AMBULATORY_CARE_PROVIDER_SITE_OTHER): Payer: BLUE CROSS/BLUE SHIELD | Admitting: Cardiology

## 2018-07-29 VITALS — BP 130/80 | HR 53 | Ht 70.0 in | Wt 227.8 lb

## 2018-07-29 DIAGNOSIS — I480 Paroxysmal atrial fibrillation: Secondary | ICD-10-CM | POA: Diagnosis not present

## 2018-07-29 DIAGNOSIS — E7800 Pure hypercholesterolemia, unspecified: Secondary | ICD-10-CM | POA: Diagnosis not present

## 2018-07-29 NOTE — Patient Instructions (Signed)
Medication Instructions:   NO CHANGE  Testing/Procedures:  Your physician has requested that you have an echocardiogram. Echocardiography is a painless test that uses sound waves to create images of your heart. It provides your doctor with information about the size and shape of your heart and how well your heart's chambers and valves are working. This procedure takes approximately one hour. There are no restrictions for this procedure.    Follow-Up:  Your physician recommends that you schedule a follow-up appointment in: 12 MONTHS WITH DR CRENSHAW PLEASE GIVE OUR OFFICE A CALL 2 MONTHS PRIOR TO THAT APPOINTMENT TIME TO SCHEDULE   CALL IN December TO SCHEDULE APPOINTMENT IN [place]

## 2018-07-30 ENCOUNTER — Ambulatory Visit: Payer: BLUE CROSS/BLUE SHIELD | Admitting: Cardiology

## 2018-07-31 ENCOUNTER — Encounter: Payer: Self-pay | Admitting: Cardiology

## 2018-08-04 ENCOUNTER — Encounter (INDEPENDENT_AMBULATORY_CARE_PROVIDER_SITE_OTHER): Payer: Self-pay | Admitting: Family Medicine

## 2018-10-19 ENCOUNTER — Encounter (INDEPENDENT_AMBULATORY_CARE_PROVIDER_SITE_OTHER): Payer: Self-pay | Admitting: Family Medicine

## 2019-02-23 ENCOUNTER — Other Ambulatory Visit: Payer: Self-pay

## 2019-03-16 ENCOUNTER — Other Ambulatory Visit (INDEPENDENT_AMBULATORY_CARE_PROVIDER_SITE_OTHER): Payer: Self-pay | Admitting: Family Medicine

## 2019-03-16 ENCOUNTER — Other Ambulatory Visit: Payer: Self-pay | Admitting: Cardiology

## 2019-07-22 NOTE — Progress Notes (Deleted)
HPI: FUparoxysmal atrial fibrillation. Note he had a prolonged vagal episode following an exercise treadmill requiring transient CPR. Echo 3/15 shows normal LV function, mild AI, mild MR, mild RVE, mild TR. Echocardiogram ordered at last office visit but not performed. Since I last saw him,  Current Outpatient Medications  Medication Sig Dispense Refill  . aspirin 81 MG tablet Take 81 mg by mouth daily.    Marland Kitchen atorvastatin (LIPITOR) 40 MG tablet TAKE ONE TABLET EVERY DAY 90 tablet 2  . lansoprazole (PREVACID) 15 MG capsule Take 15 mg by mouth daily at 12 noon.    . metoprolol succinate (TOPROL-XL) 25 MG 24 hr tablet Take 1 tablet (25 mg total) by mouth daily. (Patient taking differently: Take 50 mg by mouth daily. ) 90 tablet 2   No current facility-administered medications for this visit.     Past Medical History:  Diagnosis Date  . GERD   . HYPERLIPIDEMIA   . OSA (obstructive sleep apnea) 01/02/2018  . PAROXYSMAL ATRIAL FIBRILLATION   . Syncope and collapse     Past Surgical History:  Procedure Laterality Date  . TONSILLECTOMY      Social History   Socioeconomic History  . Marital status: Single    Spouse name: Not on file  . Number of children: Not on file  . Years of education: Not on file  . Highest education level: Not on file  Occupational History  . Not on file  Tobacco Use  . Smoking status: Never Smoker  . Smokeless tobacco: Never Used  Substance and Sexual Activity  . Alcohol use: No  . Drug use: No  . Sexual activity: Not on file  Other Topics Concern  . Not on file  Social History Narrative  . Not on file   Social Determinants of Health   Financial Resource Strain:   . Difficulty of Paying Living Expenses: Not on file  Food Insecurity:   . Worried About Programme researcher, broadcasting/film/video in the Last Year: Not on file  . Ran Out of Food in the Last Year: Not on file  Transportation Needs:   . Lack of Transportation (Medical): Not on file  . Lack of  Transportation (Non-Medical): Not on file  Physical Activity:   . Days of Exercise per Week: Not on file  . Minutes of Exercise per Session: Not on file  Stress:   . Feeling of Stress : Not on file  Social Connections:   . Frequency of Communication with Friends and Family: Not on file  . Frequency of Social Gatherings with Friends and Family: Not on file  . Attends Religious Services: Not on file  . Active Member of Clubs or Organizations: Not on file  . Attends Banker Meetings: Not on file  . Marital Status: Not on file  Intimate Partner Violence:   . Fear of Current or Ex-Partner: Not on file  . Emotionally Abused: Not on file  . Physically Abused: Not on file  . Sexually Abused: Not on file    Family History  Problem Relation Age of Onset  . Heart attack Father   . Coronary artery disease Other     ROS: no fevers or chills, productive cough, hemoptysis, dysphasia, odynophagia, melena, hematochezia, dysuria, hematuria, rash, seizure activity, orthopnea, PND, pedal edema, claudication. Remaining systems are negative.  Physical Exam: Well-developed well-nourished in no acute distress.  Skin is warm and dry.  HEENT is normal.  Neck is supple.  Chest  is clear to auscultation with normal expansion.  Cardiovascular exam is regular rate and rhythm.  Abdominal exam nontender or distended. No masses palpated. Extremities show no edema. neuro grossly intact  ECG- personally reviewed  A/P  1 paroxysmal atrial fibrillation-patient remains in sinus rhythm and has had no recurrent episodes by report.  Plan to continue Toprol at present dose.  CHADS2-VASc 0.  Continue aspirin 81 mg daily.  2 hyperlipidemia-continue statin.  Kirk Ruths, MD

## 2019-07-29 ENCOUNTER — Ambulatory Visit: Payer: BLUE CROSS/BLUE SHIELD | Admitting: Cardiology

## 2019-10-06 NOTE — Progress Notes (Deleted)
HPI: FUparoxysmal atrial fibrillation. Note he had a prolonged vagal episode following an exercise treadmill requiring transient CPR. Echo 3/15 shows normal LV function, mild AI, mild MR, mild RVE, mild TR. Repeat echocardiogram ordered at last office visit but not performed.  Since I last saw him,  Current Outpatient Medications  Medication Sig Dispense Refill  . aspirin 81 MG tablet Take 81 mg by mouth daily.    Marland Kitchen atorvastatin (LIPITOR) 40 MG tablet TAKE ONE TABLET EVERY DAY 90 tablet 2  . lansoprazole (PREVACID) 15 MG capsule Take 15 mg by mouth daily at 12 noon.    . metoprolol succinate (TOPROL-XL) 25 MG 24 hr tablet Take 1 tablet (25 mg total) by mouth daily. (Patient taking differently: Take 50 mg by mouth daily. ) 90 tablet 2   No current facility-administered medications for this visit.     Past Medical History:  Diagnosis Date  . GERD   . HYPERLIPIDEMIA   . OSA (obstructive sleep apnea) 01/02/2018  . PAROXYSMAL ATRIAL FIBRILLATION   . Syncope and collapse     Past Surgical History:  Procedure Laterality Date  . TONSILLECTOMY      Social History   Socioeconomic History  . Marital status: Single    Spouse name: Not on file  . Number of children: Not on file  . Years of education: Not on file  . Highest education level: Not on file  Occupational History  . Not on file  Tobacco Use  . Smoking status: Never Smoker  . Smokeless tobacco: Never Used  Substance and Sexual Activity  . Alcohol use: No  . Drug use: No  . Sexual activity: Not on file  Other Topics Concern  . Not on file  Social History Narrative  . Not on file   Social Determinants of Health   Financial Resource Strain:   . Difficulty of Paying Living Expenses:   Food Insecurity:   . Worried About Charity fundraiser in the Last Year:   . Arboriculturist in the Last Year:   Transportation Needs:   . Film/video editor (Medical):   Marland Kitchen Lack of Transportation (Non-Medical):     Physical Activity:   . Days of Exercise per Week:   . Minutes of Exercise per Session:   Stress:   . Feeling of Stress :   Social Connections:   . Frequency of Communication with Friends and Family:   . Frequency of Social Gatherings with Friends and Family:   . Attends Religious Services:   . Active Member of Clubs or Organizations:   . Attends Archivist Meetings:   Marland Kitchen Marital Status:   Intimate Partner Violence:   . Fear of Current or Ex-Partner:   . Emotionally Abused:   Marland Kitchen Physically Abused:   . Sexually Abused:     Family History  Problem Relation Age of Onset  . Heart attack Father   . Coronary artery disease Other     ROS: no fevers or chills, productive cough, hemoptysis, dysphasia, odynophagia, melena, hematochezia, dysuria, hematuria, rash, seizure activity, orthopnea, PND, pedal edema, claudication. Remaining systems are negative.  Physical Exam: Well-developed well-nourished in no acute distress.  Skin is warm and dry.  HEENT is normal.  Neck is supple.  Chest is clear to auscultation with normal expansion.  Cardiovascular exam is regular rate and rhythm.  Abdominal exam nontender or distended. No masses palpated. Extremities show no edema. neuro grossly intact  ECG-  personally reviewed  A/P  1 paroxysmal atrial fibrillation-patient has had no recurrences since last office visit.  Plan to continue Toprol at present dose.  Repeat echocardiogram.CHADSvasc 0.  Continue aspirin 81 mg daily.  2 hyperlipidemia-continue statin.  Olga Millers, MD

## 2019-10-13 ENCOUNTER — Ambulatory Visit: Payer: Self-pay | Admitting: Cardiology

## 2019-12-09 ENCOUNTER — Telehealth (INDEPENDENT_AMBULATORY_CARE_PROVIDER_SITE_OTHER): Payer: Self-pay | Admitting: Family Medicine

## 2019-12-09 DIAGNOSIS — Z125 Encounter for screening for malignant neoplasm of prostate: Secondary | ICD-10-CM

## 2019-12-09 DIAGNOSIS — E7800 Pure hypercholesterolemia, unspecified: Secondary | ICD-10-CM

## 2019-12-09 NOTE — Telephone Encounter (Signed)
Spoke to pt and advised will fax, voiced understanding.

## 2019-12-09 NOTE — Telephone Encounter (Signed)
Pt wanted to know if Dr. Keturah Barre can send his lab order in to [place] in [facility] before his appt.    [phone]

## 2019-12-14 NOTE — Progress Notes (Signed)
HPI: FUparoxysmal atrial fibrillation. Note he had a prolonged vagal episode following an exercise treadmill requiring transient CPR in the past. Echo 3/15 shows normal LV function, mild AI, mild MR, mild RVE, mild TR. Also with h/o atrial fibrillation. Since I last saw him,he denies dyspnea, chest pain, palpitations or syncope.  Current Outpatient Medications  Medication Sig Dispense Refill  . aspirin 81 MG tablet Take 81 mg by mouth daily.    Marland Kitchen atorvastatin (LIPITOR) 40 MG tablet TAKE ONE TABLET EVERY DAY 90 tablet 2  . lansoprazole (PREVACID) 15 MG capsule Take 15 mg by mouth daily at 12 noon.    . metoprolol succinate (TOPROL-XL) 25 MG 24 hr tablet Take 1 tablet (25 mg total) by mouth daily. (Patient taking differently: Take 50 mg by mouth daily. ) 90 tablet 2   No current facility-administered medications for this visit.     Past Medical History:  Diagnosis Date  . GERD   . HYPERLIPIDEMIA   . OSA (obstructive sleep apnea) 01/02/2018  . PAROXYSMAL ATRIAL FIBRILLATION   . Syncope and collapse     Past Surgical History:  Procedure Laterality Date  . TONSILLECTOMY      Social History   Socioeconomic History  . Marital status: Single    Spouse name: Not on file  . Number of children: Not on file  . Years of education: Not on file  . Highest education level: Not on file  Occupational History  . Not on file  Tobacco Use  . Smoking status: Never Smoker  . Smokeless tobacco: Never Used  Vaping Use  . Vaping Use: Never used  Substance and Sexual Activity  . Alcohol use: No  . Drug use: No  . Sexual activity: Not on file  Other Topics Concern  . Not on file  Social History Narrative  . Not on file   Social Determinants of Health   Financial Resource Strain:   . Difficulty of Paying Living Expenses:   Food Insecurity:   . Worried About Programme researcher, broadcasting/film/video in the Last Year:   . Barista in the Last Year:   Transportation Needs:   . Automotive engineer (Medical):   Marland Kitchen Lack of Transportation (Non-Medical):   Physical Activity:   . Days of Exercise per Week:   . Minutes of Exercise per Session:   Stress:   . Feeling of Stress :   Social Connections:   . Frequency of Communication with Friends and Family:   . Frequency of Social Gatherings with Friends and Family:   . Attends Religious Services:   . Active Member of Clubs or Organizations:   . Attends Banker Meetings:   Marland Kitchen Marital Status:   Intimate Partner Violence:   . Fear of Current or Ex-Partner:   . Emotionally Abused:   Marland Kitchen Physically Abused:   . Sexually Abused:     Family History  Problem Relation Age of Onset  . Heart attack Father   . Coronary artery disease Other     ROS: no fevers or chills, productive cough, hemoptysis, dysphasia, odynophagia, melena, hematochezia, dysuria, hematuria, rash, seizure activity, orthopnea, PND, pedal edema, claudication. Remaining systems are negative.  Physical Exam: Well-developed well-nourished in no acute distress.  Skin is warm and dry.  HEENT is normal.  Neck is supple.  Chest is clear to auscultation with normal expansion.  Cardiovascular exam is regular rate and rhythm.  Abdominal exam nontender or distended.  No masses palpated. Extremities show no edema. neuro grossly intact  ECG-sinus bradycardia at a rate of 47, no ST changes.  Personally reviewed  A/P  1 paroxysmal atrial fibrillation-patient remains in sinus rhythm.  Continue Toprol at present dose.  CHA2DS2-VASc is 0.  We will therefore not anticoagulate.  Can consider flecainide or referral for ablation in the future if needed.    2 hyperlipidemia-continue statin.  3 history of mild aortic insufficiency-we will plan repeat echocardiogram.  Olga Millers, MD

## 2019-12-17 ENCOUNTER — Ambulatory Visit (INDEPENDENT_AMBULATORY_CARE_PROVIDER_SITE_OTHER): Payer: Self-pay | Admitting: Family Medicine

## 2019-12-17 ENCOUNTER — Encounter (INDEPENDENT_AMBULATORY_CARE_PROVIDER_SITE_OTHER): Payer: Self-pay | Admitting: Family Medicine

## 2019-12-17 ENCOUNTER — Other Ambulatory Visit: Payer: Self-pay

## 2019-12-17 VITALS — BP 102/74 | HR 48 | Temp 97.3°F | Resp 18 | Ht 71.0 in | Wt 220.0 lb

## 2019-12-17 DIAGNOSIS — E7800 Pure hypercholesterolemia, unspecified: Secondary | ICD-10-CM

## 2019-12-17 DIAGNOSIS — I480 Paroxysmal atrial fibrillation: Secondary | ICD-10-CM

## 2019-12-17 DIAGNOSIS — M5416 Radiculopathy, lumbar region: Secondary | ICD-10-CM

## 2019-12-17 NOTE — Progress Notes (Signed)
[facility]  [address]  [place] [address]        Encounter Date: 12/17/2019  1:45 PM EDT      Name: Shaun Lopez  Age: 59 y.o.  DOB: [birth year]  Sex: male    Chief Complaint:   Chief Complaint   Patient presents with   . Check Up       HPI  Patient here for follow-up on his hypertension dyslipidemia and impaired glucose tolerance as well as AFib.  He reports his heart is been behaving over the last year.  He has not had any trouble with his medication and no limiting cardiovascular symptoms.  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  He gets pain in his knees at times as well as his low back in lately he has had trouble with his left foot in the plantar fascia area.  He describes the right knee pain as a pain actually radiates beyond the knee itself and feels like at times the leg was heavy.  He is worried about blood clots or other possibilities.  He had no effusion on the knee.  There is no limb swelling.    History   Family Medical History:     Problem Relation (Age of Onset)    Arthritis-rheumatoid Mother    Cancer Sister    Colon Cancer Maternal Grandfather    Heart Attack Father    Hypertension (High Blood Pressure) Mother    Other Mother        Past Medical History:   Diagnosis Date   . Atrial fibrillation (CMS HCC)    . Cancer (CMS [place])     skin 2007   . Esophageal reflux    . High cholesterol    . Melanoma (CMS [place])     2007   . Varicella      Past Surgical History:   Procedure Laterality Date   . COLONOSCOPY     . HX OTHER      Melanoma removal   . HX TONSILLECTOMY     . HX WISDOM TEETH EXTRACTION       Patient Active Problem List    Diagnosis   . OSA (obstructive sleep apnea)   . PAF (paroxysmal atrial fibrillation) (CMS HCC)   . GERD (gastroesophageal reflux disease)   . Pure hypercholesterolemia     Current Outpatient Medications   Medication Sig   . aspirin (ECOTRIN) 81 mg Oral Tablet, Delayed Release (E.C.) Take 81 mg by mouth Once a day   .  atorvastatin (LIPITOR) 40 mg Oral Tablet Take 40 mg by mouth Every evening   . LANSOPRAZOLE (PREVACID ORAL) Take 40 mg by mouth Once a day   . metoprolol succinate (TOPROL-XL) 50 mg Oral Tablet Sustained Release 24 hr TAKE ONE TABLET BY MOUTH ONCE A DAY     Social History     Tobacco Use   Smoking Status Never Smoker   Smokeless Tobacco Never Used     Social History     Substance and Sexual Activity   Alcohol Use No       Review of Systems   Constitutional: Negative for chills, fatigue and fever.   HENT: Negative for nosebleeds, sore throat and trouble swallowing.    Eyes: Negative for photophobia and pain.   Respiratory: Negative for cough and wheezing.    Cardiovascular: Negative for chest pain and leg swelling.   Gastrointestinal: Negative  for abdominal pain, constipation and diarrhea.   Endocrine: Negative for cold intolerance and heat intolerance.   Genitourinary: Negative for dysuria and urgency.   Musculoskeletal: Positive for arthralgias and back pain. Negative for joint swelling.   Neurological: Negative for seizures, syncope and headaches.   Hematological: Negative for adenopathy. Does not bruise/bleed easily.   Psychiatric/Behavioral: Negative for confusion and hallucinations. The patient is not nervous/anxious.         Examination  Vitals: BP 102/74   Pulse 48   Temp 36.3 C (97.3 F)   Resp 18   Ht 1.803 m (5\' 11" )   Wt 99.8 kg (220 lb)   SpO2 97%   BMI 30.68 kg/m         Physical Exam  Constitutional:       Appearance: He is well-developed.   HENT:      Head: Normocephalic and atraumatic.   Eyes:      General: No scleral icterus.     Pupils: Pupils are equal, round, and reactive to light.   Cardiovascular:      Rate and Rhythm: Normal rate and regular rhythm.   Pulmonary:      Breath sounds: No wheezing or rales.   Abdominal:      General: Bowel sounds are normal.      Palpations: Abdomen is soft.      Tenderness: There is no abdominal tenderness.   Musculoskeletal:      Cervical back: Neck  supple.      Comments: No instability in the knee.  He does have calluses over the kneecaps themselves but no effusions and no provocative pain with manipulation   Lymphadenopathy:      Cervical: No cervical adenopathy.   Skin:     General: Skin is warm.      Findings: No rash.   Neurological:      Mental Status: He is alert and oriented to person, place, and time.      Cranial Nerves: No cranial nerve deficit.   Psychiatric:         Behavior: Behavior normal.         Assessment and Plan  Josel was seen today for check up.    PAF (paroxysmal atrial fibrillation) (CMS HCC)    Pure hypercholesterolemia    Lumbar radiculitis    I believe the pain in his knee is actually referred pain from his back.  We talked about core strengthening working on that a little bit.  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  For his paroxysmal atrial fibrillation is numbers are doing well.  He has improved his sugar and his cholesterol panel over the last few years.    Return in about 6 months (around 06/18/2020).    Hollace Kinnier, MD

## 2019-12-22 ENCOUNTER — Ambulatory Visit (INDEPENDENT_AMBULATORY_CARE_PROVIDER_SITE_OTHER): Payer: 59 | Admitting: Cardiology

## 2019-12-22 ENCOUNTER — Other Ambulatory Visit: Payer: Self-pay

## 2019-12-22 ENCOUNTER — Encounter: Payer: Self-pay | Admitting: Cardiology

## 2019-12-22 VITALS — BP 110/62 | HR 47 | Ht 71.0 in | Wt 221.1 lb

## 2019-12-22 DIAGNOSIS — I480 Paroxysmal atrial fibrillation: Secondary | ICD-10-CM

## 2019-12-22 DIAGNOSIS — I351 Nonrheumatic aortic (valve) insufficiency: Secondary | ICD-10-CM

## 2019-12-22 DIAGNOSIS — E7800 Pure hypercholesterolemia, unspecified: Secondary | ICD-10-CM

## 2019-12-22 NOTE — Patient Instructions (Signed)
Medication Instructions:  NO CHANGE *If you need a refill on your cardiac medications before your next appointment, please call your pharmacy*   Lab Work: If you have labs (blood work) drawn today and your tests are completely normal, you will receive your results only by: Marland Kitchen MyChart Message (if you have MyChart) OR . A paper copy in the mail If you have any lab test that is abnormal or we need to change your treatment, we will call you to review the results.   Testing/Procedures:  Your physician has requested that you have an echocardiogram. Echocardiography is a painless test that uses sound waves to create images of your heart. It provides your doctor with information about the size and shape of your heart and how well your heart's chambers and valves are working. This procedure takes approximately one hour. There are no restrictions for this procedure.[facility]    Follow-Up: At [facility], you and your health needs are our priority.  As part of our continuing mission to provide you with exceptional heart care, we have created designated Provider Care Teams.  These Care Teams include your primary Cardiologist (physician) and Advanced Practice Providers (APPs -  Physician Assistants and Nurse Practitioners) who all work together to provide you with the care you need, when you need it.  We recommend signing up for the patient portal called "MyChart".  Sign up information is provided on this After Visit Summary.  MyChart is used to connect with patients for Virtual Visits (Telemedicine).  Patients are able to view lab/test results, encounter notes, upcoming appointments, etc.  Non-urgent messages can be sent to your provider as well.   To learn more about what you can do with MyChart, go to [email or web address].    Your next appointment:   12 month(s)  The format for your next appointment:   Either In Person or Virtual  Provider:   Olga Millers, MD

## 2020-01-13 ENCOUNTER — Telehealth (INDEPENDENT_AMBULATORY_CARE_PROVIDER_SITE_OTHER): Payer: Self-pay | Admitting: Family Medicine

## 2020-01-13 NOTE — Telephone Encounter (Signed)
He should be able to get the vaccine shot fine without any impact on his mother.  It would be his own immune response.  It would also provide some protection for the people around him.

## 2020-01-13 NOTE — Telephone Encounter (Signed)
Pt would like to speak w/ Dr. Keturah Barre regarding the [facility] SHOT. He wants to know will it effect his mother if he gets it.     [phone]

## 2020-01-13 NOTE — Telephone Encounter (Signed)
Spoke to pt and advised of such, voiced understanding.

## 2020-02-04 ENCOUNTER — Inpatient Hospital Stay (HOSPITAL_BASED_OUTPATIENT_CLINIC_OR_DEPARTMENT_OTHER): Admission: RE | Admit: 2020-02-04 | Payer: 59 | Source: Ambulatory Visit

## 2020-02-23 ENCOUNTER — Other Ambulatory Visit: Payer: Self-pay

## 2020-02-23 ENCOUNTER — Ambulatory Visit (HOSPITAL_BASED_OUTPATIENT_CLINIC_OR_DEPARTMENT_OTHER)
Admission: RE | Admit: 2020-02-23 | Discharge: 2020-02-23 | Disposition: A | Discharge status: Home / Self Care | Payer: 59 | Source: Ambulatory Visit | Attending: Cardiology | Admitting: Cardiology

## 2020-02-23 DIAGNOSIS — I351 Nonrheumatic aortic (valve) insufficiency: Secondary | ICD-10-CM | POA: Insufficient documentation

## 2020-02-23 LAB — ECHOCARDIOGRAM COMPLETE
AR max vel: 2.34 cm2
AV Area VTI: 2.23 cm2
AV Area mean vel: 2.15 cm2
AV Mean grad: 7 mmHg
AV Peak grad: 12.4 mmHg
Ao pk vel: 1.76 m/s
Area-P 1/2: 1.68 cm2
P 1/2 time: 721 msec
S' Lateral: 3.28 cm

## 2020-02-23 NOTE — Progress Notes (Deleted)
  Echocardiogram 2D Echocardiogram has been performed.  Gerda Diss 02/23/2020, 3:50 PM

## 2020-05-10 ENCOUNTER — Other Ambulatory Visit (INDEPENDENT_AMBULATORY_CARE_PROVIDER_SITE_OTHER): Payer: Self-pay | Admitting: Family Medicine

## 2020-05-10 ENCOUNTER — Other Ambulatory Visit: Payer: Self-pay | Admitting: Cardiology

## 2020-05-10 MED ORDER — ATORVASTATIN CALCIUM 40 MG PO TABS
40.0000 mg | ORAL_TABLET | Freq: Every day | ORAL | 3 refills | Status: DC
Start: 2020-05-10 — End: 2021-02-22

## 2020-05-10 NOTE — Telephone Encounter (Signed)
*  STAT* If patient is at the pharmacy, call can be transferred to refill team.   1. Which medications need to be refilled? (please list name of each medication and dose if known) atorvastatin (LIPITOR) 40 MG tablet  2. Which pharmacy/location (including street and city if local pharmacy) is medication to be sent to? [facility] - [place], [place] - [address]  3. Do they need a 30 day or 90 day supply? 90

## 2020-06-23 ENCOUNTER — Encounter (INDEPENDENT_AMBULATORY_CARE_PROVIDER_SITE_OTHER): Payer: Self-pay | Admitting: Family Medicine

## 2020-08-17 ENCOUNTER — Other Ambulatory Visit: Payer: Self-pay

## 2020-08-17 ENCOUNTER — Encounter (INDEPENDENT_AMBULATORY_CARE_PROVIDER_SITE_OTHER): Payer: Self-pay | Admitting: Family Medicine

## 2020-08-17 ENCOUNTER — Other Ambulatory Visit (INDEPENDENT_AMBULATORY_CARE_PROVIDER_SITE_OTHER): Payer: Self-pay

## 2020-08-17 ENCOUNTER — Ambulatory Visit (INDEPENDENT_AMBULATORY_CARE_PROVIDER_SITE_OTHER): Payer: Self-pay | Admitting: Family Medicine

## 2020-08-17 VITALS — BP 124/80 | HR 64 | Temp 98.0°F | Resp 18 | Ht 71.0 in | Wt 221.0 lb

## 2020-08-17 DIAGNOSIS — M8949 Other hypertrophic osteoarthropathy, multiple sites: Secondary | ICD-10-CM

## 2020-08-17 DIAGNOSIS — J0410 Acute tracheitis without obstruction: Secondary | ICD-10-CM

## 2020-08-17 DIAGNOSIS — E7800 Pure hypercholesterolemia, unspecified: Secondary | ICD-10-CM

## 2020-08-17 DIAGNOSIS — R059 Cough, unspecified: Secondary | ICD-10-CM

## 2020-08-17 DIAGNOSIS — M159 Polyosteoarthritis, unspecified: Secondary | ICD-10-CM

## 2020-08-17 MED ORDER — BENZONATATE 200 MG CAPSULE
200.00 mg | ORAL_CAPSULE | Freq: Three times a day (TID) | ORAL | 0 refills | Status: AC
Start: 2020-08-17 — End: 2020-08-24

## 2020-08-17 NOTE — Progress Notes (Signed)
[facility]  [place] [id number]        Encounter Date: 08/17/2020  1:00 PM EST      Name: Shaun Lopez  Age: 60 y.o.  DOB: [birth year]  Sex: male    Chief Complaint:   Chief Complaint   Patient presents with   . Check Up   . Cough       HPI  Patient here for evaluation of a cough.  He started off with a respiratory style illness that was in the upper respiratory with sinus congestion cough and drainage.  He is managed over the last few weeks with some Mucinex and reports that it does help a little bit but the persistence of the cough is bothersome.  It is not necessarily wheezy like it was in the beginning but is more irritant.  When he works himself upper gets moving it will be more intense.  It is not really producing anything he has had no hemoptysis or pleurisy.  He was tested twice for COVID was negative.    History   Family Medical History:     Problem Relation (Age of Onset)    Arthritis-rheumatoid Mother    Cancer Sister    Colon Cancer Maternal Grandfather    Heart Attack Father    Hypertension (High Blood Pressure) Mother    Other Mother        Past Medical History:   Diagnosis Date   . Atrial fibrillation (CMS HCC)    . Cancer (CMS [place])     skin 2007   . Esophageal reflux    . High cholesterol    . Melanoma (CMS [place])     2007   . Varicella      Past Surgical History:   Procedure Laterality Date   . COLONOSCOPY     . HX OTHER      Melanoma removal   . HX TONSILLECTOMY     . HX WISDOM TEETH EXTRACTION       Patient Active Problem List    Diagnosis   . OSA (obstructive sleep apnea)   . PAF (paroxysmal atrial fibrillation) (CMS HCC)   . GERD (gastroesophageal reflux disease)   . Pure hypercholesterolemia     Current Outpatient Medications   Medication Sig   . aspirin (ECOTRIN) 81 mg Oral Tablet, Delayed Release (E.C.) Take 81 mg by mouth Once a day   . atorvastatin (LIPITOR) 40 mg Oral Tablet Take 40 mg by mouth Every evening   . Benzonatate (TESSALON) 200 mg Oral Capsule Take  1 Capsule (200 mg total) by mouth Three times a day for 7 days   . LANSOPRAZOLE (PREVACID ORAL) Take 40 mg by mouth Once a day   . metoprolol succinate (TOPROL-XL) 50 mg Oral Tablet Sustained Release 24 hr Take 1 Tablet (50 mg total) by mouth Once a day     Social History     Tobacco Use   Smoking Status Never Smoker   Smokeless Tobacco Never Used     Social History     Substance and Sexual Activity   Alcohol Use No       Review of Systems   Constitutional: Negative for chills, fatigue and fever.   HENT: Negative for nosebleeds, sore throat and trouble swallowing.    Eyes: Negative for photophobia and pain.   Respiratory: Positive for cough. Negative for wheezing.    Cardiovascular: Negative for chest pain and leg swelling.  Gastrointestinal: Negative for abdominal pain, constipation and diarrhea.   Endocrine: Negative for cold intolerance and heat intolerance.   Genitourinary: Negative for dysuria and urgency.   Musculoskeletal: Negative for back pain and joint swelling.   Neurological: Negative for seizures, syncope and headaches.   Hematological: Negative for adenopathy. Does not bruise/bleed easily.   Psychiatric/Behavioral: Negative for confusion and hallucinations. The patient is not nervous/anxious.         Examination  Vitals: BP 124/80   Pulse 64   Temp 36.7 C (98 F)   Resp 18   Ht 1.803 m (5\' 11" )   Wt 100 kg (221 lb)   SpO2 98%   BMI 30.82 kg/m         Physical Exam  Constitutional:       Appearance: He is well-developed.   HENT:      Head: Normocephalic and atraumatic.   Eyes:      General: No scleral icterus.     Pupils: Pupils are equal, round, and reactive to light.   Cardiovascular:      Rate and Rhythm: Normal rate and regular rhythm.   Pulmonary:      Breath sounds: No wheezing or rales.   Abdominal:      General: Bowel sounds are normal.      Palpations: Abdomen is soft.      Tenderness: There is no abdominal tenderness.   Musculoskeletal:      Cervical back: Neck supple.    Lymphadenopathy:      Cervical: No cervical adenopathy.   Skin:     General: Skin is warm.      Findings: No rash.   Neurological:      Mental Status: He is alert and oriented to person, place, and time.      Cranial Nerves: No cranial nerve deficit.   Psychiatric:         Behavior: Behavior normal.         Assessment and Plan  Shaun Lopez was seen today for check up and cough.    Cough    Tracheitis    Primary osteoarthritis involving multiple joints    Other orders  -     Benzonatate (TESSALON) 200 mg Oral Capsule; Take 1 Capsule (200 mg total) by mouth Three times a day for 7 days    At this point I think he just has a little bit of a post infectious cough.  Will try to suppress with the Tessalon for about a week to see if it improves he is already on acid reduction and seasonally will take antihistamines when he gets flared there but right now this does not seem to be flared by his typical allergies.  If he has persisting after this will consider x-ray and some other treatment.    Return if symptoms worsen or fail to improve.    Hollace Kinnier, MD

## 2020-08-25 ENCOUNTER — Other Ambulatory Visit (INDEPENDENT_AMBULATORY_CARE_PROVIDER_SITE_OTHER): Payer: Self-pay

## 2020-08-25 ENCOUNTER — Telehealth (INDEPENDENT_AMBULATORY_CARE_PROVIDER_SITE_OTHER): Payer: Self-pay | Admitting: Family Medicine

## 2020-08-25 NOTE — Telephone Encounter (Signed)
-----   Message from Hollace Kinnier, MD sent at 08/25/2020  8:12 AM EST -----  PSA is in normal range  Kidney liver and blood counts are stable.  Triglycerides are slightly higher than we like.  Make sure watching carbs and sweets in diet.

## 2020-08-25 NOTE — Telephone Encounter (Signed)
Spoke to pt and advised of such, voiced understanding.

## 2020-09-28 NOTE — Progress Notes (Deleted)
      HPI: FUparoxysmal atrial fibrillation. Note he had a prolonged vagal episode following an exercise treadmill requiring transient CPR in the past. Also with h/o atrial fibrillation. Echocardiogram repeated September 2021 and showed ejection fraction 55 to 60%, mild left ventricular hypertrophy, grade 1 diastolic dysfunction, mild aortic insufficiency and mildly dilated ascending aorta at 40 mm.  Since I last saw him,   Current Outpatient Medications  Medication Sig Dispense Refill  . aspirin 81 MG tablet Take 81 mg by mouth daily.    Marland Kitchen atorvastatin (LIPITOR) 40 MG tablet Take 1 tablet (40 mg total) by mouth daily. 90 tablet 3  . lansoprazole (PREVACID) 15 MG capsule Take 15 mg by mouth daily at 12 noon.    . metoprolol succinate (TOPROL-XL) 25 MG 24 hr tablet Take 1 tablet (25 mg total) by mouth daily. (Patient taking differently: Take 50 mg by mouth daily. ) 90 tablet 2   No current facility-administered medications for this visit.     Past Medical History:  Diagnosis Date  . GERD   . HYPERLIPIDEMIA   . OSA (obstructive sleep apnea) 01/02/2018  . PAROXYSMAL ATRIAL FIBRILLATION   . Syncope and collapse     Past Surgical History:  Procedure Laterality Date  . TONSILLECTOMY      Social History   Socioeconomic History  . Marital status: Single    Spouse name: Not on file  . Number of children: Not on file  . Years of education: Not on file  . Highest education level: Not on file  Occupational History  . Not on file  Tobacco Use  . Smoking status: Never Smoker  . Smokeless tobacco: Never Used  Vaping Use  . Vaping Use: Never used  Substance and Sexual Activity  . Alcohol use: No  . Drug use: No  . Sexual activity: Not on file  Other Topics Concern  . Not on file  Social History Narrative  . Not on file   Social Determinants of Health   Financial Resource Strain: Not on file  Food Insecurity: Not on file  Transportation Needs: Not on file  Physical  Activity: Not on file  Stress: Not on file  Social Connections: Not on file  Intimate Partner Violence: Not on file    Family History  Problem Relation Age of Onset  . Heart attack Father   . Coronary artery disease Other     ROS: no fevers or chills, productive cough, hemoptysis, dysphasia, odynophagia, melena, hematochezia, dysuria, hematuria, rash, seizure activity, orthopnea, PND, pedal edema, claudication. Remaining systems are negative.  Physical Exam: Well-developed well-nourished in no acute distress.  Skin is warm and dry.  HEENT is normal.  Neck is supple.  Chest is clear to auscultation with normal expansion.  Cardiovascular exam is regular rate and rhythm.  Abdominal exam nontender or distended. No masses palpated. Extremities show no edema. neuro grossly intact  ECG- personally reviewed  A/P  1 paroxysmal atrial fibrillation-patient is in sinus rhythm today.  We will continue Toprol at present dose for rate control if atrial fibrillation recurs.  CHA2DS2-VASc is 0 and we have therefore not anticoagulated.  If he has more frequent episodes in the future we will consider antiarrhythmic versus referral for ablation.  2 history of mild aortic insufficiency-follow-up echocardiogram recently showed no change.  3 hyperlipidemia-continue statin.  Olga Millers, MD

## 2020-10-02 ENCOUNTER — Ambulatory Visit (INDEPENDENT_AMBULATORY_CARE_PROVIDER_SITE_OTHER): Payer: 59 | Admitting: Pulmonary Disease

## 2020-10-02 ENCOUNTER — Other Ambulatory Visit: Payer: Self-pay

## 2020-10-02 ENCOUNTER — Encounter: Payer: Self-pay | Admitting: Pulmonary Disease

## 2020-10-02 VITALS — BP 110/62 | HR 54 | Temp 98.0°F | Ht 71.0 in | Wt 230.4 lb

## 2020-10-02 DIAGNOSIS — G4733 Obstructive sleep apnea (adult) (pediatric): Secondary | ICD-10-CM | POA: Diagnosis not present

## 2020-10-02 NOTE — Patient Instructions (Signed)
Will have [employer] change your auto CPAP setting to 5 - 9 cm water pressure  Call or send email if you are still having trouble with CPAP settings  Follow up in 2 to 3 months

## 2020-10-02 NOTE — Progress Notes (Signed)
[facility]  Chief Complaint  Patient presents with  . Follow-up    Problems with Cpap. Wants to continue therapy.      Constitutional:  BP 110/62 (BP Location: Left Arm, Cuff Size: Normal)   Pulse (!) 54   Temp 98 F (36.7 C) (Oral)   Ht 5\' 11"  (1.803 m)   Wt 230 lb 6.4 oz (104.5 kg)   SpO2 99%   BMI 32.13 kg/m   Past Medical History:  GERD, HLD, PAF, Syncope  Past Surgical History:  Colton Moore  has a past surgical history that includes Tonsillectomy.  Brief Summary:  Colton Moore is a 60 y.o. male with obstructive sleep apnea.      Subjective:   Colton Moore was last seen in 2019.  Colton Moore had home sleep study that showed mild to moderate sleep apnea.  Colton Moore was started on auto CPAP.  Used full face mask, and this was comfortable.  Colton Moore didn't have insurance at the time, and purchased CPAP device on his own.  His main issue with CPAP set up related to feeling like pressure would get too high.  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  Physical Exam:   Appearance - well kempt   ENMT - no sinus tenderness, no oral exudate, no LAN, Mallampati 3 airway, no stridor  Respiratory - equal breath sounds bilaterally, no wheezing or rales  CV - s1s2 regular rate and rhythm, no murmurs  Ext - no clubbing, no edema  Skin - no rashes  Psych - normal mood and affect   Sleep Tests:   HST 12/31/17 >> AHI 13.9, SaO2 low 87%  Cardiac Tests:   Echo 02/23/20 >> EF 55 to 60%, mild LVH, grade 1 DD, aortic root 40 mm  Social History:  Colton Moore  reports that Colton Moore has never smoked. Colton Moore has never used smokeless tobacco. Colton Moore reports that Colton Moore does not drink alcohol and does not use drugs.  Family History:  His family history includes Coronary artery disease in an other family member; Heart attack in his father.     Assessment/Plan:   Obstructive sleep apnea. - reviewed his  sleep study results with him - discussed how untreated sleep apnea can impact his health - treatment options reviewed - Colton Moore would like to try resuming auto CPAP first - Colton Moore uses [employer] for his DME - will arrange for his auto CPAP to be changed to 5 - 9 cm H2O - if Colton Moore continues to have difficulty with CPAP set up, then Colton Moore might need an in lab titration study  Paroxysmal atrial fibrillation. - Colton Moore is followed by Dr. 04/24/20 with [facility]   Time Spent Involved in Patient Care on Day of Examination:  22 minutes  Follow up:  Patient Instructions  Will have [employer] change your auto CPAP setting to 5 - 9 cm water pressure  Call or send email if you are still having trouble with CPAP settings  Follow up in 2 to 3 months   Medication List:   Allergies as of 10/02/2020      Reactions   Clindamycin Diarrhea, Nausea Only   Penicillins Other (See Comments)   Childhood  Other reaction(s):  Other Adverse Reaction (Add comment) Kidney problems      Medication List       Accurate as of October 02, 2020  9:21 AM. If you have any questions, ask your nurse or doctor.        aspirin 81 MG tablet Take 81 mg by mouth daily.   atorvastatin 40 MG tablet Commonly known as: LIPITOR Take 1 tablet (40 mg total) by mouth daily.   lansoprazole 15 MG capsule Commonly known as: PREVACID Take 15 mg by mouth daily at 12 noon.   metoprolol succinate 25 MG 24 hr tablet Commonly known as: TOPROL-XL Take 1 tablet (25 mg total) by mouth daily. What changed: how much to take       Signature:  Coralyn Helling, MD [facility] Pager - [phone] 10/02/2020, 9:21 AM

## 2020-10-05 ENCOUNTER — Ambulatory Visit: Payer: 59 | Admitting: Cardiology

## 2020-12-11 ENCOUNTER — Ambulatory Visit: Payer: 59 | Admitting: Pulmonary Disease

## 2020-12-27 ENCOUNTER — Ambulatory Visit: Payer: 59 | Admitting: Pulmonary Disease

## 2021-01-03 ENCOUNTER — Other Ambulatory Visit (INDEPENDENT_AMBULATORY_CARE_PROVIDER_SITE_OTHER): Payer: Self-pay | Admitting: Family Medicine

## 2021-01-09 ENCOUNTER — Other Ambulatory Visit: Payer: Self-pay | Admitting: Orthopedic Surgery

## 2021-01-09 DIAGNOSIS — M5416 Radiculopathy, lumbar region: Secondary | ICD-10-CM

## 2021-01-13 ENCOUNTER — Ambulatory Visit
Admission: RE | Admit: 2021-01-13 | Discharge: 2021-01-13 | Disposition: A | Discharge status: Home / Self Care | Payer: 59 | Source: Ambulatory Visit | Attending: Orthopedic Surgery | Admitting: Orthopedic Surgery

## 2021-01-13 DIAGNOSIS — M5416 Radiculopathy, lumbar region: Secondary | ICD-10-CM

## 2021-02-07 ENCOUNTER — Encounter (INDEPENDENT_AMBULATORY_CARE_PROVIDER_SITE_OTHER): Payer: Self-pay | Admitting: Family Medicine

## 2021-02-07 ENCOUNTER — Ambulatory Visit (INDEPENDENT_AMBULATORY_CARE_PROVIDER_SITE_OTHER): Payer: Self-pay | Admitting: Family Medicine

## 2021-02-07 ENCOUNTER — Other Ambulatory Visit: Payer: No Typology Code available for payment source | Attending: Family Medicine | Admitting: Family Medicine

## 2021-02-07 ENCOUNTER — Ambulatory Visit: Payer: 59 | Admitting: Cardiology

## 2021-02-07 VITALS — BP 122/80 | HR 57 | Temp 98.2°F | Resp 16 | Ht 71.0 in | Wt 231.2 lb

## 2021-02-07 DIAGNOSIS — E7800 Pure hypercholesterolemia, unspecified: Secondary | ICD-10-CM

## 2021-02-07 DIAGNOSIS — M1611 Unilateral primary osteoarthritis, right hip: Secondary | ICD-10-CM

## 2021-02-07 DIAGNOSIS — D649 Anemia, unspecified: Secondary | ICD-10-CM | POA: Insufficient documentation

## 2021-02-07 DIAGNOSIS — M5416 Radiculopathy, lumbar region: Secondary | ICD-10-CM

## 2021-02-07 DIAGNOSIS — M5136 Other intervertebral disc degeneration, lumbar region: Secondary | ICD-10-CM

## 2021-02-07 LAB — CREATINE KINASE (CK), TOTAL, SERUM: CREATINE KINASE: 62 U/L (ref <=250)

## 2021-02-07 LAB — CBC
HCT: 37.4 % — ABNORMAL LOW (ref 38.9–52.0)
HGB: 12.2 g/dL — ABNORMAL LOW (ref 13.4–17.5)
MCH: 29.8 pg (ref 26.0–32.0)
MCHC: 32.6 g/dL (ref 31.0–35.5)
MCV: 91.2 fL (ref 78.0–100.0)
MPV: 9.8 fL (ref 8.7–12.5)
PLATELETS: 275 10*3/uL (ref 150–400)
RBC: 4.1 10*6/uL — ABNORMAL LOW (ref 4.50–6.10)
RDW-CV: 12.6 % (ref 11.5–15.5)
WBC: 7.3 10*3/uL (ref 3.7–11.0)

## 2021-02-07 LAB — COMPREHENSIVE METABOLIC PNL, FASTING
ALBUMIN: 3.8 g/dL (ref 3.2–4.8)
ALKALINE PHOSPHATASE: 131 U/L — ABNORMAL HIGH (ref 20–130)
ALT (SGPT): 24 U/L (ref <=52)
ANION GAP: 9 mmol/L
AST (SGOT): 18 U/L (ref <=35)
BILIRUBIN TOTAL: 0.7 mg/dL (ref 0.3–1.2)
BUN/CREA RATIO: 16
BUN: 18 mg/dL (ref 10–25)
CALCIUM: 9.1 mg/dL (ref 8.8–10.3)
CHLORIDE: 108 mmol/L (ref 98–111)
CO2 TOTAL: 23 mmol/L (ref 21–35)
CREATININE: 1.15 mg/dL (ref <=1.30)
ESTIMATED GFR: >60 mL/min/{1.73_m2}
GLUCOSE: 86 mg/dL (ref 70–110)
POTASSIUM: 4.4 mmol/L (ref 3.5–5.0)
PROTEIN TOTAL: 6.1 g/dL (ref 6.0–8.3)
SODIUM: 140 mmol/L (ref 135–145)

## 2021-02-07 LAB — C-REACTIVE PROTEIN(CRP),INFLAMMATION: CRP INFLAMMATION: 15.4 mg/L — ABNORMAL HIGH (ref <=5.0)

## 2021-02-07 NOTE — Progress Notes (Signed)
[facility]  [place] [address]        Encounter Date: 02/07/2021  2:15 PM EDT      Name: Shaun Lopez  Age: 60 y.o.  DOB: [birth year]  Sex: male    Chief Complaint:   Chief Complaint   Patient presents with   . Stiffness     Muscles and joints       HPI  Patient here for evaluation of some muscle stiffness and aches.  He started a cascade of an accelerating lumbar radiculopathy down the right leg.  He discussed a little bit of the minor symptoms with [place] last summer as a matter fact.  He was not doing anything distinct this time but did get an acceleration of his pains and [facility]  They did update his MRIs and it did show pretty significant disease in his lower lumbar spine.  He has been on some gabapentin and meloxicam.  In the recovery phase its crossed into the L4 region and he did get a little bit of hip pain and been found to have some hip arthritis as well.  He just is concerned as his whole body seems achy now and he just can not seem to turn the corner.  He is just now and rolled in some formal physical therapy.  He has only had a few sessions so far.    History   Family Medical History:     Problem Relation (Age of Onset)    Arthritis-rheumatoid Mother    Cancer Sister    Colon Cancer Maternal Grandfather    Heart Attack Father    Hypertension (High Blood Pressure) Mother    Other Mother        Past Medical History:   Diagnosis Date   . Atrial fibrillation (CMS HCC)    . Cancer (CMS [place])     skin 2007   . Esophageal reflux    . High cholesterol    . Melanoma (CMS [place])     2007   . Varicella      Past Surgical History:   Procedure Laterality Date   . COLONOSCOPY     . HX OTHER      Melanoma removal   . HX TONSILLECTOMY     . HX WISDOM TEETH EXTRACTION       Patient Active Problem List    Diagnosis   . OSA (obstructive sleep apnea)   . PAF (paroxysmal atrial fibrillation) (CMS HCC)   . GERD (gastroesophageal reflux disease)   . Pure  hypercholesterolemia     Current Outpatient Medications   Medication Sig   . aspirin (ECOTRIN) 81 mg Oral Tablet, Delayed Release (E.C.) Take 81 mg by mouth Once a day   . atorvastatin (LIPITOR) 40 mg Oral Tablet Take 40 mg by mouth Every evening   . gabapentin (NEURONTIN) 300 mg Oral Capsule gabapentin 300 mg capsule   TAKE 1 CAPSULE BY MOUTH THREE TIMES DAILY   . LANSOPRAZOLE (PREVACID ORAL) Take 40 mg by mouth Once a day   . meloxicam (MOBIC) 15 mg Oral Tablet Every one hour   . metoprolol succinate (TOPROL-XL) 50 mg Oral Tablet Sustained Release 24 hr TAKE ONE TABLET EVERY DAY     Social History     Tobacco Use   Smoking Status Never Smoker   Smokeless Tobacco Never Used     Social History  Substance and Sexual Activity   Alcohol Use No       Review of Systems   Constitutional: Negative for chills, fatigue and fever.   HENT: Negative for nosebleeds, sore throat and trouble swallowing.    Eyes: Negative for photophobia and pain.   Respiratory: Negative for cough and wheezing.    Cardiovascular: Negative for chest pain and leg swelling.   Gastrointestinal: Negative for abdominal pain, constipation and diarrhea.   Endocrine: Negative for cold intolerance and heat intolerance.   Genitourinary: Negative for dysuria and urgency.   Musculoskeletal: Negative for back pain and joint swelling.   Neurological: Negative for seizures, syncope and headaches.   Hematological: Negative for adenopathy. Does not bruise/bleed easily.   Psychiatric/Behavioral: Negative for confusion and hallucinations. The patient is not nervous/anxious.         Examination  Vitals: BP 122/80   Pulse 57   Temp 36.8 C (98.2 F)   Resp 16   Ht 1.803 m ('5\' 11"'$ )   Wt 105 kg (231 lb 3.2 oz)   SpO2 97%   BMI 32.25 kg/m         Physical Exam  Constitutional:       Appearance: He is not diaphoretic.   HENT:      Head: Normocephalic and atraumatic.   Eyes:      General: No scleral icterus.     Pupils: Pupils are equal, round, and reactive to  light.   Neck:      Thyroid: No thyromegaly.   Cardiovascular:      Rate and Rhythm: Normal rate and regular rhythm.      Heart sounds: No murmur heard.  Pulmonary:      Effort: Pulmonary effort is normal.      Breath sounds: Normal breath sounds. No wheezing.   Abdominal:      General: Bowel sounds are normal.      Palpations: Abdomen is soft.      Tenderness: There is no abdominal tenderness.   Musculoskeletal:      Cervical back: Normal range of motion.      Comments: Antalgic gait positive straight leg raise seated.   Lymphadenopathy:      Cervical: No cervical adenopathy.   Skin:     General: Skin is warm and dry.      Findings: No rash.   Neurological:      Mental Status: He is alert and oriented to person, place, and time.   Psychiatric:         Judgment: Judgment normal.         Assessment and Plan  Shaun Lopez was seen today for stiffness.    DDD (degenerative disc disease), lumbar  -     CBC; Future  -     COMPREHENSIVE METABOLIC PNL, FASTING; Future  -     CREATINE KINASE (CK), TOTAL, SERUM; Future  -     C-REACTIVE PROTEIN(CRP),INFLAMMATION; Future    Lumbar radiculopathy    Primary osteoarthritis of right hip    Pure hypercholesterolemia    Other orders  -     gabapentin (NEURONTIN) 300 mg Oral Capsule; gabapentin 300 mg capsule   TAKE 1 CAPSULE BY MOUTH THREE TIMES DAILY  -     meloxicam (MOBIC) 15 mg Oral Tablet; Every one hour       Will update labs.  Look good his CPK and inflammatory markers to rule out any underlying increased inflammation.  Discussed the nature of degenerative disc and  its recovery process.      Return if symptoms worsen or fail to improve.    Hollace Kinnier, MD

## 2021-02-08 ENCOUNTER — Telehealth (INDEPENDENT_AMBULATORY_CARE_PROVIDER_SITE_OTHER): Payer: Self-pay | Admitting: Family Medicine

## 2021-02-08 DIAGNOSIS — R7982 Elevated C-reactive protein (CRP): Secondary | ICD-10-CM

## 2021-02-08 DIAGNOSIS — D649 Anemia, unspecified: Secondary | ICD-10-CM

## 2021-02-08 LAB — VITAMIN B12: VITAMIN B 12: 765 pg/mL (ref 180–914)

## 2021-02-08 LAB — IRON TRANSFERRIN AND TIBC
IRON (TRANSFERRIN) SATURATION: 13 % — ABNORMAL LOW (ref 20–50)
IRON: 44 ug/dL — ABNORMAL LOW (ref 60–140)
TOTAL IRON BINDING CAPACITY: 337 ug/dL (ref 250–425)
TRANSFERRIN: 241 mg/dL (ref 200–360)

## 2021-02-08 NOTE — Telephone Encounter (Addendum)
Patient notified, understanding verbalized.      ----- Message from Hollace Kinnier, MD sent at 02/08/2021  8:18 AM EDT -----  Does have slight anemia.  Please add B12 and iron studies to the blood work.  Kidney liver are stable.  CRP does have some elevation which is nonspecific but with some of his body symptoms would repeat in about 2 weeks trend.  Muscle enzyme CPK is okay

## 2021-02-09 ENCOUNTER — Telehealth (INDEPENDENT_AMBULATORY_CARE_PROVIDER_SITE_OTHER): Payer: Self-pay | Admitting: Family Medicine

## 2021-02-09 NOTE — Telephone Encounter (Signed)
Patient notified of results. Heme test cards left at front desk. Patient states he will find out when last colonoscopy was and let us know.

## 2021-02-09 NOTE — Telephone Encounter (Signed)
PT was returning your call about his blood work       [phone]

## 2021-02-09 NOTE — Telephone Encounter (Addendum)
Attempted to contact patient, no answer and no voicemail.    ----- Message from Hollace Kinnier, MD sent at 02/09/2021  8:25 AM EDT -----  Labs indicate iron deficiency anemia.  Please heme test stools and confirm when last colonoscopy was performed.

## 2021-02-15 ENCOUNTER — Ambulatory Visit (INDEPENDENT_AMBULATORY_CARE_PROVIDER_SITE_OTHER): Payer: No Typology Code available for payment source | Admitting: Family Medicine

## 2021-02-15 ENCOUNTER — Encounter (INDEPENDENT_AMBULATORY_CARE_PROVIDER_SITE_OTHER): Payer: Self-pay | Admitting: Family Medicine

## 2021-02-15 ENCOUNTER — Other Ambulatory Visit (INDEPENDENT_AMBULATORY_CARE_PROVIDER_SITE_OTHER): Payer: Self-pay

## 2021-02-15 VITALS — BP 140/88 | HR 65 | Temp 97.6°F | Resp 16 | Ht 71.0 in | Wt 225.6 lb

## 2021-02-15 DIAGNOSIS — M5136 Other intervertebral disc degeneration, lumbar region: Secondary | ICD-10-CM

## 2021-02-15 DIAGNOSIS — M2550 Pain in unspecified joint: Secondary | ICD-10-CM

## 2021-02-15 DIAGNOSIS — Z1211 Encounter for screening for malignant neoplasm of colon: Secondary | ICD-10-CM

## 2021-02-15 DIAGNOSIS — D509 Iron deficiency anemia, unspecified: Secondary | ICD-10-CM

## 2021-02-15 MED ORDER — POLYSACCHARIDE IRON COMPLEX 150 MG IRON CAPSULE
150.0000 mg | ORAL_CAPSULE | Freq: Two times a day (BID) | ORAL | 2 refills | Status: DC
Start: 2021-02-15 — End: 2021-08-02

## 2021-02-15 NOTE — Progress Notes (Signed)
[facility]  [place] [id number]        Encounter Date: 02/15/2021  9:45 AM EDT      Name: Shaun Lopez Shaun Lopez  Age: 60 y.o.  DOB: [birth year]  Sex: male    Chief Complaint:   Chief Complaint   Patient presents with   . Follow Up     On going problems       HPI   Patient here to discuss some of his ongoing aches and pains and fatigue.  He does have an identified iron deficiency anemia now.  He was surprised as he feels like he eats a well-rounded diet.  He did bring in the stool cards for [place] today and tested out heme negative on the cards themselves.  He notes that when everything onset at 1st he had a little bit of good response to steroids that then went away.  He has not had a hot swollen joint.  He was concerned about a possible industrial exposure exposure to water sources in the house that could be making him feel poorly.  History   Family Medical History:     Problem Relation (Age of Onset)    Arthritis-rheumatoid Mother    Cancer Sister    Colon Cancer Maternal Grandfather    Heart Attack Father    Hypertension (High Blood Pressure) Mother    Other Mother        Past Medical History:   Diagnosis Date   . Atrial fibrillation (CMS HCC)    . Cancer (CMS [place])     skin 2007   . Esophageal reflux    . High cholesterol    . Melanoma (CMS [place])     2007   . Varicella      Past Surgical History:   Procedure Laterality Date   . COLONOSCOPY     . HX OTHER      Melanoma removal   . HX TONSILLECTOMY     . HX WISDOM TEETH EXTRACTION       Patient Active Problem List    Diagnosis   . DDD (degenerative disc disease), lumbar   . Iron deficiency anemia   . OSA (obstructive sleep apnea)   . PAF (paroxysmal atrial fibrillation) (CMS HCC)   . GERD (gastroesophageal reflux disease)   . Pure hypercholesterolemia     Current Outpatient Medications   Medication Sig   . aspirin (ECOTRIN) 81 mg Oral Tablet, Delayed Release (E.C.) Take 81 mg by mouth Once a day   . atorvastatin (LIPITOR) 40 mg Oral Tablet Take 40  mg by mouth Every evening   . LANSOPRAZOLE (PREVACID ORAL) Take 40 mg by mouth Once a day   . meloxicam (MOBIC) 15 mg Oral Tablet Every one hour   . metoprolol succinate (TOPROL-XL) 50 mg Oral Tablet Sustained Release 24 hr TAKE ONE TABLET EVERY DAY   . polysaccharide iron complex (FERREX 150) 150 mg iron Oral Capsule Take 1 Capsule (150 mg total) by mouth Twice daily for 90 days     Social History     Tobacco Use   Smoking Status Never Smoker   Smokeless Tobacco Never Used     Social History     Substance and Sexual Activity   Alcohol Use No       Review of Systems   Constitutional: Positive for fatigue. Negative for chills and fever.   HENT: Negative for nosebleeds, sore throat and trouble swallowing.  Eyes: Negative for photophobia and pain.   Respiratory: Negative for cough and wheezing.    Cardiovascular: Negative for chest pain and leg swelling.   Gastrointestinal: Negative for abdominal pain, constipation and diarrhea.   Endocrine: Negative for cold intolerance and heat intolerance.   Genitourinary: Negative for dysuria and urgency.   Musculoskeletal: Positive for arthralgias, back pain and neck pain. Negative for joint swelling.   Neurological: Negative for seizures, syncope and headaches.   Hematological: Negative for adenopathy. Does not bruise/bleed easily.   Psychiatric/Behavioral: Negative for confusion and hallucinations. The patient is not nervous/anxious.         Examination  Vitals: BP (!) 140/88   Pulse 65   Temp 36.4 C (97.6 F)   Resp 16   Ht 1.803 m ('5\' 11"'$ )   Wt 102 kg (225 lb 9.6 oz)   SpO2 97%   BMI 31.46 kg/m         Physical Exam  Constitutional:       Appearance: He is well-developed.   HENT:      Head: Normocephalic and atraumatic.   Eyes:      General: No scleral icterus.     Pupils: Pupils are equal, round, and reactive to light.   Cardiovascular:      Rate and Rhythm: Normal rate and regular rhythm.   Pulmonary:      Breath sounds: No wheezing or rales.   Abdominal:       General: Bowel sounds are normal.      Palpations: Abdomen is soft.      Tenderness: There is no abdominal tenderness.   Musculoskeletal:      Cervical back: Neck supple.      Comments: No joint effusion or edema   Lymphadenopathy:      Cervical: No cervical adenopathy.   Skin:     General: Skin is warm.      Findings: No rash.   Neurological:      Mental Status: He is alert and oriented to person, place, and time.      Cranial Nerves: No cranial nerve deficit.   Psychiatric:         Behavior: Behavior normal.         Assessment and Plan  Latisha was seen today for follow up.    Iron deficiency anemia    DDD (degenerative disc disease), lumbar    Polyarthralgia    Other orders  -     polysaccharide iron complex (FERREX 150) 150 mg iron Oral Capsule; Take 1 Capsule (150 mg total) by mouth Twice daily for 90 days    Will place on some aggressive iron replacement.  Talked about dark stools and constipation as complications.  Will repeat some blood work in about 4-6 weeks to see how his numbers are trending along with the CRP.  Considerations for evaluation by hematology or rheumatology based on his trends and physical symptoms at the time    Return in about 6 weeks (around 03/29/2021).    Hollace Kinnier, MD

## 2021-02-15 NOTE — Progress Notes (Signed)
Stool heme test cards negative.

## 2021-02-20 ENCOUNTER — Telehealth: Payer: Self-pay | Admitting: Cardiology

## 2021-02-20 NOTE — Telephone Encounter (Signed)
Pt c/o of Chest Pain: 1. Are you having CP right now? No  2. Are you experiencing any other symptoms (ex. SOB, nausea, vomiting, sweating)? SOB and sweating  3. How long have you been experiencing CP? Off and on for a about 3 weeks 4. Is your CP continuous or coming and going? Coming and going. Patient also have pain on the back left side. Patient also wakes up in the middle of the night due to the pain. Left arm also feels like a cramp  5. Have you taken Nitroglycerin?

## 2021-02-20 NOTE — Telephone Encounter (Signed)
Returned call to patient of Dr. Jens Som who reports chest pain for about 3 weeks, off and on. He reports his left arm will wake him up at night, like it is cramping. He reports leg pain also - notices in the morning, cramping sensation. He is due for a yearly visit. Scheduled for 02/22/21 with Verneita Griffes PA @ 11:15am

## 2021-02-21 NOTE — Progress Notes (Signed)
Cardiology Office Note:    Date:  02/22/2021   ID:  Colton Moore, DOB [birth year], MRN [id number]  PCP:  Pcp, No Referring MD: No ref. provider found    [facility]  Cardiologist:  Olga Millers, MD   Reason for visit: Chest pain  History of Present Illness:    Colton Moore is a 60 y.o. male with a hx of paroxysmal atrial fibrillation, history of prolonged vagal episode following an exercise treadmill requiring transient CPR.  He last saw Dr. Jens Som in July 2021 and was doing well with no chest pain or palpitations.  Patient called our office in September 6 reporting cramping pain.  Today, he states he may have overexerted himself with recent landscaping.  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  He mentions the other day after what he thinks was overexertion, he was diaphoretic and tired.  He does mention chronic back pain with bulging disks and pinched nerve.  He has associated  sciatica.  He was recently started on a steroid Dosepak and gabapentin.  After 1 week he felt better.  After the steroid Dosepak was completed, he had rebound significant pain in his back and thighs.  He is doing physical therapy which has helped.  He also mentions left arm cramping at night.  He states this is different than " the feeling you get when your arm falls asleep."  He does sleep on his left side.  He saw his primary care physician and Ortho.  His gabapentin was stopped approximately 8 days ago and he was started on ferrous sulfate for iron deficiency anemia.  He mentions some left upper chest discomfort though he is not sure if this is related to overexerting his rotator cuff.  Denies palpitations.  He states he is aware of when he has episodes of atrial fibrillation.  He had a couple episodes last year.  These typically are triggered with fumes from gas or when he is using a chainsaw.  Sometimes it feels like a  butterfly and he will rest.  Rarely does his palpitations continue into the next day.  His overall burden has improved after decreasing caffeine intake.  He was previously on flecainide but states it did not make a difference.  He has has recent shortness of breath he thinks is due to the heat and wearing a mask.    Past Medical History:  Diagnosis Date   GERD    HYPERLIPIDEMIA    OSA (obstructive sleep apnea) 01/02/2018   PAROXYSMAL ATRIAL FIBRILLATION    Syncope and collapse     Past Surgical History:  Procedure Laterality Date   TONSILLECTOMY      Current Medications: Current Meds  Medication Sig   aspirin 81 MG tablet Take 81 mg by mouth daily.   lansoprazole (PREVACID) 15 MG capsule Take 15 mg by mouth daily at 12 noon.   metoprolol succinate (TOPROL-XL) 25 MG 24 hr tablet Take 1 tablet (25 mg total) by mouth daily. (Patient taking differently: Take 50 mg by mouth daily.)   rosuvastatin (CRESTOR) 20 MG tablet Take 1 tablet (20 mg total) by mouth daily.   [DISCONTINUED] atorvastatin (LIPITOR) 40 MG tablet Take 1 tablet (40 mg total) by mouth daily.     Allergies:   Clindamycin and Penicillins   Social History   Socioeconomic History   Marital status: Single  Spouse name: Not on file   Number of children: Not on file   Years of education: Not on file   Highest education level: Not on file  Occupational History   Not on file  Tobacco Use   Smoking status: Never   Smokeless tobacco: Never  Vaping Use   Vaping Use: Never used  Substance and Sexual Activity   Alcohol use: No   Drug use: No   Sexual activity: Not on file  Other Topics Concern   Not on file  Social History Narrative   Not on file   Social Determinants of Health   Financial Resource Strain: Not on file  Food Insecurity: Not on file  Transportation Needs: Not on file  Physical Activity: Not on file  Stress: Not on file  Social Connections: Not on file     Family History: The patient's  family history includes Coronary artery disease in an other family member; Heart attack in his father.  ROS:   Please see the history of present illness.     EKGs/Labs/Other Studies Reviewed:    EKG:  The ekg ordered today demonstrates sinus bradycardia, heart rate 56, left axis deviation, QRS duration 100 ms.  Recent Labs: No results found for requested labs within last 8760 hours.  Recent Lipid Panel    Component Value Date/Time   CHOL 178 08/17/2013 0801   TRIG 94.0 08/17/2013 0801   HDL 51.90 08/17/2013 0801   CHOLHDL 3 08/17/2013 0801   VLDL 18.8 08/17/2013 0801   LDLCALC 107 (H) 08/17/2013 0801    Physical Exam:    VS:  BP 114/72 (BP Location: Left Arm, Patient Position: Sitting, Cuff Size: Large)   Pulse (!) 56   Ht 5\' 11"  (1.803 m)   Wt 226 lb (102.5 kg)   SpO2 98%   BMI 31.52 kg/m     Wt Readings from Last 3 Encounters:  02/22/21 226 lb (102.5 kg)  10/02/20 230 lb 6.4 oz (104.5 kg)  12/22/19 221 lb 1.3 oz (100.3 kg)     GEN:  Well nourished, well developed in no acute distress HEENT: Normal NECK: No JVD; No carotid bruits CARDIAC: RRR, no murmurs, rubs, gallops RESPIRATORY:  Clear to auscultation without rales, wheezing or rhonchi  ABDOMEN: Soft, non-tender, non-distended MUSCULOSKELETAL: No edema; No deformity  SKIN: Warm and dry NEUROLOGIC:  Alert and oriented PSYCHIATRIC:  Normal affect   ASSESSMENT AND PLAN   Chest pain - history of prolonged vagal episode following an exercise treadmill requiring transient CPR -making me nervous to order another exercise stress test.. -With recent discomfort, I think it is reasonable to order a CTA of the coronaries to rule out CAD.  Check CMET to ensure kidney function is okay prior to contrast load. -CT of the coronaries can also help differentiate if patient should have more aggressive lipid control. -If CTA is negative for coronary disease, myalgias/CP are likely secondary to muscle overuse and chronic back and  nerve issues.  Paroxysmal atrial fibrillation, rare episodes -EKG in 2020 & 2021 & today with sinus brady -Normal LV function on echo 02/2020 -Continue Toprol -CHA2DS2-VASc score is 0, not on anticoagulation -I discussed ablation with him again today.  If symptoms are more frequent or longer lasting, I think he is a good candidate for A. fib ablation.  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 -We will check fasting lipids and serum CK. -We will change him to Crestor 20 mg daily to see if these are statin induced myalgias. - Discussed cholesterol lowering diets - Mediterranean diet, DASH diet, vegetarian diet, low-carbohydrate diet and avoidance of trans fats.  Discussed healthier choice substitutes.  Nuts, high-fiber foods, and fiber supplements may also improve lipids.    Iron deficiency anemia -Now that has been a few weeks on ferrous sulfate, would recheck iron studies and CBC.  Disposition - Follow-up in approximately 4 weeks after CTA to review results and see if myalgias have improved with statin change.   Medication Adjustments/Labs and Tests Ordered: Current medicines are reviewed at length with the patient today.  Concerns regarding medicines are outlined above.  Orders Placed This Encounter  Procedures   CT ANGIO CHEST AORTA W/CM & OR WO/CM   Comprehensive metabolic panel   Lipid panel   CBC   Iron, TIBC and Ferritin Panel   EKG 12-Lead    Meds ordered this encounter  Medications   rosuvastatin (CRESTOR) 20 MG tablet    Sig: Take 1 tablet (20 mg total) by mouth daily.    Dispense:  90 tablet    Refill:  3     Patient Instructions  Medication Instructions:  Stop Lipitor Start Crestor 20 mg (1 Tablet Daily). *If you need a refill on your  cardiac medications before your next appointment, please call your pharmacy*   Lab Work: CBC, CMET, Lipid, Iron Level. If you have labs (blood work) drawn today and your tests are completely normal, you will receive your results only by: MyChart Message (if you have MyChart) OR A paper copy in the mail If you have any lab test that is abnormal or we need to change your treatment, we will call you to review the results.   Testing/Procedures: [facility], [address] Your physician has requested that you have cardiac CT. Cardiac computed tomography (CT) is a painless test that uses an x-ray machine to take clear, detailed pictures of your heart. For further information please visit [email or web address] Please follow instruction sheet as given.     Follow-Up: At [facility], you and your health needs are our priority.  As part of our continuing mission to provide you with exceptional heart care, we have created designated Provider Care Teams.  These Care Teams include your primary Cardiologist (physician) and Advanced Practice Providers (APPs -  Physician Assistants and Nurse Practitioners) who all work together to provide you with the care you need, when you need it.  We recommend signing up for the patient portal called "MyChart".  Sign up information is provided on this After Visit Summary.  MyChart is used to connect with patients for Virtual Visits (Telemedicine).  Patients are able to view lab/test results, encounter notes, upcoming appointments, etc.  Non-urgent messages can be sent to your provider as well.   To learn more about what you can do with MyChart, go to [email or web address].    Your next appointment:   March 21, 2021 8:20 AM  The format for your next appointment:   In Person  Provider:   Olga Millers, MD       Signed, Cannon Kettle, PA-C  02/22/2021 12:10 PM    [facility]

## 2021-02-22 ENCOUNTER — Encounter: Payer: Self-pay | Admitting: Physician Assistant

## 2021-02-22 ENCOUNTER — Other Ambulatory Visit: Payer: Self-pay

## 2021-02-22 ENCOUNTER — Ambulatory Visit (INDEPENDENT_AMBULATORY_CARE_PROVIDER_SITE_OTHER): Payer: 59 | Admitting: Physician Assistant

## 2021-02-22 VITALS — BP 114/72 | HR 56 | Ht 71.0 in | Wt 226.0 lb

## 2021-02-22 DIAGNOSIS — I351 Nonrheumatic aortic (valve) insufficiency: Secondary | ICD-10-CM

## 2021-02-22 DIAGNOSIS — I480 Paroxysmal atrial fibrillation: Secondary | ICD-10-CM

## 2021-02-22 DIAGNOSIS — R079 Chest pain, unspecified: Secondary | ICD-10-CM

## 2021-02-22 DIAGNOSIS — E7800 Pure hypercholesterolemia, unspecified: Secondary | ICD-10-CM | POA: Diagnosis not present

## 2021-02-22 DIAGNOSIS — I77810 Thoracic aortic ectasia: Secondary | ICD-10-CM

## 2021-02-22 DIAGNOSIS — D508 Other iron deficiency anemias: Secondary | ICD-10-CM

## 2021-02-22 DIAGNOSIS — R072 Precordial pain: Secondary | ICD-10-CM | POA: Diagnosis not present

## 2021-02-22 DIAGNOSIS — M791 Myalgia, unspecified site: Secondary | ICD-10-CM

## 2021-02-22 MED ORDER — ROSUVASTATIN CALCIUM 20 MG PO TABS: 20.0000 mg | ORAL_TABLET | Freq: Every day | ORAL | 3 refills | Status: DC

## 2021-02-22 NOTE — Patient Instructions (Signed)
Medication Instructions:  Stop Lipitor Start Crestor 20 mg (1 Tablet Daily). *If you need a refill on your cardiac medications before your next appointment, please call your pharmacy*   Lab Work: CBC, CMET, Lipid, Iron Level. If you have labs (blood work) drawn today and your tests are completely normal, you will receive your results only by: MyChart Message (if you have MyChart) OR A paper copy in the mail If you have any lab test that is abnormal or we need to change your treatment, we will call you to review the results.   Testing/Procedures: [facility], [address] Your physician has requested that you have cardiac CT. Cardiac computed tomography (CT) is a painless test that uses an x-ray machine to take clear, detailed pictures of your heart. For further information please visit [email or web address] Please follow instruction sheet as given.     Follow-Up: At [facility], you and your health needs are our priority.  As part of our continuing mission to provide you with exceptional heart care, we have created designated Provider Care Teams.  These Care Teams include your primary Cardiologist (physician) and Advanced Practice Providers (APPs -  Physician Assistants and Nurse Practitioners) who all work together to provide you with the care you need, when you need it.  We recommend signing up for the patient portal called "MyChart".  Sign up information is provided on this After Visit Summary.  MyChart is used to connect with patients for Virtual Visits (Telemedicine).  Patients are able to view lab/test results, encounter notes, upcoming appointments, etc.  Non-urgent messages can be sent to your provider as well.   To learn more about what you can do with MyChart, go to [email or web address].    Your next appointment:   March 21, 2021 8:20 AM  The format for your next appointment:   In Person  Provider:   Olga Millers, MD

## 2021-02-23 ENCOUNTER — Ambulatory Visit: Payer: 59 | Admitting: Pulmonary Disease

## 2021-02-26 LAB — LIPID PANEL
Chol/HDL Ratio: 3.1 ratio (ref 0.0–5.0)
Cholesterol, Total: 186 mg/dL (ref 100–199)
HDL: 60 mg/dL (ref >39)
LDL Chol Calc (NIH): 94 mg/dL (ref 0–99)
Triglycerides: 189 mg/dL — ABNORMAL HIGH (ref 0–149)
VLDL Cholesterol Cal: 32 mg/dL (ref 5–40)

## 2021-02-26 LAB — COMPREHENSIVE METABOLIC PANEL
ALT: 75 IU/L — ABNORMAL HIGH (ref 0–44)
AST: 62 IU/L — ABNORMAL HIGH (ref 0–40)
Albumin/Globulin Ratio: 2 (ref 1.2–2.2)
Albumin: 4.3 g/dL (ref 3.8–4.8)
Alkaline Phosphatase: 200 IU/L — ABNORMAL HIGH (ref 44–121)
BUN/Creatinine Ratio: 14 (ref 10–24)
BUN: 15 mg/dL (ref 8–27)
Bilirubin Total: 0.6 mg/dL (ref 0.0–1.2)
CO2: 24 mmol/L (ref 20–29)
Calcium: 9.8 mg/dL (ref 8.6–10.2)
Chloride: 101 mmol/L (ref 96–106)
Creatinine, Ser: 1.04 mg/dL (ref 0.76–1.27)
Globulin, Total: 2.2 g/dL (ref 1.5–4.5)
Glucose: 89 mg/dL (ref 65–99)
Potassium: 5.1 mmol/L (ref 3.5–5.2)
Sodium: 140 mmol/L (ref 134–144)
Total Protein: 6.5 g/dL (ref 6.0–8.5)
eGFR: 82 mL/min/{1.73_m2} (ref >59)

## 2021-02-26 LAB — CBC
Hematocrit: 40.1 % (ref 37.5–51.0)
Hemoglobin: 13.7 g/dL (ref 13.0–17.7)
MCH: 29.1 pg (ref 26.6–33.0)
MCHC: 34.2 g/dL (ref 31.5–35.7)
MCV: 85 fL (ref 79–97)
Platelets: 277 10*3/uL (ref 150–450)
RBC: 4.7 x10E6/uL (ref 4.14–5.80)
RDW: 11.8 % (ref 11.6–15.4)
WBC: 8.8 10*3/uL (ref 3.4–10.8)

## 2021-02-26 LAB — IRON,TIBC AND FERRITIN PANEL
Ferritin: 473 ng/mL — ABNORMAL HIGH (ref 30–400)
Iron Saturation: 22 % (ref 15–55)
Iron: 59 ug/dL (ref 38–169)
Total Iron Binding Capacity: 272 ug/dL (ref 250–450)
UIBC: 213 ug/dL (ref 111–343)

## 2021-02-27 ENCOUNTER — Encounter (INDEPENDENT_AMBULATORY_CARE_PROVIDER_SITE_OTHER): Payer: Self-pay | Admitting: Family Medicine

## 2021-03-01 ENCOUNTER — Telehealth: Payer: Self-pay

## 2021-03-01 NOTE — Telephone Encounter (Addendum)
Attempted to call patient unable to leave voicemail.----- Message from Cannon Kettle, PA-C sent at 03/01/2021  1:07 PM EDT ----- - Your liver function is elevated slightly.  Recommend rechecking liver function in 1 month post statin change. (I can order at f/u visit) -Your bad cholesterol is 94.  I will follow-up on your CT results on 10/8 to determine if we need to be more aggressive about your cholesterol management. -Your blood counts are normal; you are not anemic anymore.  Your iron studies were normal. - Your kidney function and electrolytes are normal.

## 2021-03-13 ENCOUNTER — Encounter (INDEPENDENT_AMBULATORY_CARE_PROVIDER_SITE_OTHER): Payer: Self-pay | Admitting: Family Medicine

## 2021-03-13 ENCOUNTER — Other Ambulatory Visit: Payer: No Typology Code available for payment source | Attending: Family Medicine | Admitting: Family Medicine

## 2021-03-13 ENCOUNTER — Ambulatory Visit (INDEPENDENT_AMBULATORY_CARE_PROVIDER_SITE_OTHER): Payer: No Typology Code available for payment source | Admitting: Family Medicine

## 2021-03-13 VITALS — BP 116/70 | HR 66 | Temp 97.9°F | Resp 16 | Ht 71.0 in | Wt 221.8 lb

## 2021-03-13 DIAGNOSIS — R7989 Other specified abnormal findings of blood chemistry: Secondary | ICD-10-CM | POA: Insufficient documentation

## 2021-03-13 DIAGNOSIS — M5136 Other intervertebral disc degeneration, lumbar region: Secondary | ICD-10-CM

## 2021-03-13 DIAGNOSIS — D509 Iron deficiency anemia, unspecified: Secondary | ICD-10-CM

## 2021-03-13 DIAGNOSIS — M791 Myalgia, unspecified site: Secondary | ICD-10-CM

## 2021-03-13 LAB — COMPREHENSIVE METABOLIC PNL, FASTING
ALBUMIN: 4.2 g/dL (ref 3.2–4.8)
ALKALINE PHOSPHATASE: 121 U/L (ref 20–130)
ALT (SGPT): 17 U/L (ref <=52)
ANION GAP: 8 mmol/L
AST (SGOT): 15 U/L (ref <=35)
BILIRUBIN TOTAL: 1.1 mg/dL (ref 0.3–1.2)
BUN/CREA RATIO: 14
BUN: 14 mg/dL (ref 10–25)
CALCIUM: 9.6 mg/dL (ref 8.8–10.3)
CHLORIDE: 105 mmol/L (ref 98–111)
CO2 TOTAL: 25 mmol/L (ref 21–35)
CREATININE: 0.98 mg/dL (ref <=1.30)
ESTIMATED GFR: >60 mL/min/{1.73_m2}
GLUCOSE: 89 mg/dL (ref 70–110)
POTASSIUM: 4.5 mmol/L (ref 3.5–5.0)
PROTEIN TOTAL: 6.6 g/dL (ref 6.0–8.3)
SODIUM: 138 mmol/L (ref 135–145)

## 2021-03-13 LAB — HEPATITIS A (HAV) IGM ANTIBODY: HAV IGM: NEGATIVE

## 2021-03-13 LAB — CBC
HCT: 41.6 % (ref 38.9–52.0)
HGB: 13.6 g/dL (ref 13.4–17.5)
MCH: 28.8 pg (ref 26.0–32.0)
MCHC: 32.7 g/dL (ref 31.0–35.5)
MCV: 88.1 fL (ref 78.0–100.0)
MPV: 10.7 fL (ref 8.7–12.5)
PLATELETS: 263 10*3/uL (ref 150–400)
RBC: 4.72 10*6/uL (ref 4.50–6.10)
RDW-CV: 12.3 % (ref 11.5–15.5)
WBC: 8.4 10*3/uL (ref 3.7–11.0)

## 2021-03-13 LAB — HEPATITIS C ANTIBODY SCREEN WITH REFLEX TO HCV PCR: HCV ANTIBODY QUALITATIVE: NEGATIVE

## 2021-03-13 LAB — GAMMA GT: GGT: 117 U/L — ABNORMAL HIGH (ref <=70)

## 2021-03-13 LAB — HEPATITIS B CORE IGM, AB: HBV CORE IGM ANTIBODY QUALITATIVE: NEGATIVE

## 2021-03-13 LAB — HEPATITIS B SURFACE ANTIGEN: HBV SURFACE ANTIGEN QUALITATIVE: NEGATIVE

## 2021-03-13 NOTE — Progress Notes (Signed)
HPI: FU paroxysmal atrial fibrillation. Note he had a prolonged vagal episode following an exercise treadmill requiring transient CPR in the past. Echocardiogram September 2021 showed ejection fraction 55 to 60%, mild left ventricular hypertrophy, grade 1 diastolic dysfunction, mild aortic insufficiency and mildly dilated ascending aorta at 40 mm.  Patient seen with chest pain February 22, 2021 and cardiac CTA was ordered.  Patient also noted to have elevated liver functions on laboratories recently with alkaline phosphatase of 200, SGOT 62 and SGPT 75.  Since I last saw him, patient has had issues with pain in his shoulders bilaterally and arms.  Also pain in his hips not related to activities.  His statin has been placed on hold.  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  He has dyspnea with more vigorous activities but not routine activities.  He denies exertional chest pain at present.  Current Outpatient Medications  Medication Sig Dispense Refill   aspirin 81 MG tablet Take 81 mg by mouth daily.     lansoprazole (PREVACID) 15 MG capsule Take 15 mg by mouth daily at 12 noon.     metoprolol succinate (TOPROL-XL) 25 MG 24 hr tablet Take 1 tablet (25 mg total) by mouth daily. (Patient taking differently: Take 50 mg by mouth daily.) 90 tablet 2   rosuvastatin (CRESTOR) 20 MG tablet Take 1 tablet (20 mg total) by mouth daily. (Patient not taking: Reported on 03/21/2021) 90 tablet 3   No current facility-administered medications for this visit.     Past Medical History:  Diagnosis Date   GERD    HYPERLIPIDEMIA    OSA (obstructive sleep apnea) 01/02/2018   PAROXYSMAL ATRIAL FIBRILLATION    Syncope and collapse     Past Surgical History:  Procedure Laterality Date   TONSILLECTOMY      Social History   Socioeconomic History   Marital status: Single    Spouse name: Not on file   Number of children: Not on file   Years of education:  Not on file   Highest education level: Not on file  Occupational History   Not on file  Tobacco Use   Smoking status: Never   Smokeless tobacco: Never  Vaping Use   Vaping Use: Never used  Substance and Sexual Activity   Alcohol use: No   Drug use: No   Sexual activity: Not on file  Other Topics Concern   Not on file  Social History Narrative   Not on file   Social Determinants of Health   Financial Resource Strain: Not on file  Food Insecurity: Not on file  Transportation Needs: Not on file  Physical Activity: Not on file  Stress: Not on file  Social Connections: Not on file  Intimate Partner Violence: Not on file    Family History  Problem Relation Age of Onset   Heart attack Father    Coronary artery disease Other     ROS: no fevers or chills, productive cough, hemoptysis, dysphasia, odynophagia, melena, hematochezia, dysuria, hematuria, rash, seizure activity, orthopnea, PND, pedal edema, claudication. Remaining systems are negative.  Physical Exam: Well-developed well-nourished in no acute distress.  Skin is warm and dry.  HEENT is normal.  Neck is supple.  Chest is clear to auscultation with normal expansion.  Cardiovascular exam is regular rate and rhythm.  Abdominal exam nontender or distended. No masses palpated. Extremities show no edema. neuro grossly intact  A/P  1 paroxysmal atrial fibrillation-patient remains in sinus rhythm.  We will continue Toprol for rate control if atrial fibrillation recurs.  CHA2DS2-VASc is 0 and we have therefore not anticoagulated.  Will consider addition of flecainide or referral for ablation in the future if he has more frequent occurrences.  2 aortic insufficiency-mild on most recent echocardiogram.  Aortic root also mildly dilated.  He is scheduled for CTA on Friday to exclude coronary disease and this will also size aortic root.  3 hyperlipidemia-he has had some myalgias and elevated liver functions.  He is off all  statins.  We will see if his myalgias improved.  In 4 weeks if so we will try Crestor 20 mg daily and check lipids and liver 12 weeks later.  4 chest pain-symptoms improving; cardiac CTA is pending.  5 elevated liver functions-apparently improved on primary care testing.  We will resume Crestor in 4 weeks if his myalgias improve and check CK, lipids and liver 12 weeks later.  Olga Millers, MD

## 2021-03-13 NOTE — Progress Notes (Signed)
[facility]  [place] [address]        Encounter Date: 03/13/2021  8:30 AM EDT      Name: Shaun Lopez  Age: 60 y.o.  DOB: 1960/07/19  Sex: male    Chief Complaint:   Chief Complaint   Patient presents with   . Lab Results     Wants to discuss lab results       HPI  Patient here to discuss some of his blood work trend body trends.  He notes that he has had a little more achiness in his shoulders in the last several months specially with overhead activity.  He notes that he still battles with a tight hip on the right and the low back pains.  His labs that have been followed up in some outside facility showed some bump in his alkaline phosphatase and AST ALT.  He had his statin rotated as they were little suspicious that the Lipitor had been longstanding enough that he could of caused him some breakthrough symptoms.  He wonders if the combination of the gabapentin steroids and the statin triggered his body to go into this cascade.  He has no history of IV needle use.  He has drank from water hoses when he is on his job and wondered if he could have got something from the sore or contact like that.  He has never had hepatitis testing or an ultrasound of the liver before.    History   Family Medical History:     Problem Relation (Age of Onset)    Arthritis-rheumatoid Mother    Cancer Sister    Colon Cancer Maternal Grandfather    Heart Attack Father    Hypertension (High Blood Pressure) Mother    Other Mother        Past Medical History:   Diagnosis Date   . Atrial fibrillation (CMS HCC)    . Cancer (CMS [place])     skin 2007   . Esophageal reflux    . High cholesterol    . Melanoma (CMS [place])     2007   . Varicella      Past Surgical History:   Procedure Laterality Date   . COLONOSCOPY     . HX OTHER      Melanoma removal   . HX TONSILLECTOMY     . HX WISDOM TEETH EXTRACTION       Patient Active Problem List    Diagnosis   . DDD (degenerative disc disease), lumbar   . Iron deficiency  anemia   . OSA (obstructive sleep apnea)   . PAF (paroxysmal atrial fibrillation) (CMS HCC)   . GERD (gastroesophageal reflux disease)   . Pure hypercholesterolemia     Current Outpatient Medications   Medication Sig   . aspirin (ECOTRIN) 81 mg Oral Tablet, Delayed Release (E.C.) Take 81 mg by mouth Once a day   . LANSOPRAZOLE (PREVACID ORAL) Take 40 mg by mouth Once a day   . meloxicam (MOBIC) 15 mg Oral Tablet Every one hour   . metoprolol succinate (TOPROL-XL) 50 mg Oral Tablet Sustained Release 24 hr TAKE ONE TABLET EVERY DAY   . polysaccharide iron complex (FERREX 150) 150 mg iron Oral Capsule Take 1 Capsule (150 mg total) by mouth Twice daily for 90 days   . rosuvastatin (CRESTOR) 20 mg Oral Tablet Take 20 mg by mouth Once a day     Social History  Tobacco Use   Smoking Status Never Smoker   Smokeless Tobacco Never Used     Social History     Substance and Sexual Activity   Alcohol Use No       Review of Systems   Constitutional: Positive for fatigue. Negative for chills and fever.   HENT: Negative for nosebleeds, sore throat and trouble swallowing.    Eyes: Negative for photophobia and pain.   Respiratory: Negative for cough and wheezing.    Cardiovascular: Negative for chest pain and leg swelling.   Gastrointestinal: Negative for abdominal pain, constipation and diarrhea.   Endocrine: Negative for cold intolerance and heat intolerance.   Genitourinary: Negative for dysuria and urgency.   Musculoskeletal: Positive for arthralgias and myalgias. Negative for back pain and joint swelling.   Neurological: Negative for seizures, syncope and headaches.   Hematological: Negative for adenopathy. Does not bruise/bleed easily.   Psychiatric/Behavioral: Negative for confusion and hallucinations. The patient is not nervous/anxious.         Examination  Vitals: BP 116/70   Pulse 66   Temp 36.6 C (97.9 F)   Resp 16   Ht 1.803 m (5\' 11" )   Wt 101 kg (221 lb 12.8 oz)   SpO2 96%   BMI 30.93 kg/m         Physical  Exam  Constitutional:       Appearance: He is well-developed.   HENT:      Head: Normocephalic and atraumatic.   Eyes:      General: No scleral icterus.     Pupils: Pupils are equal, round, and reactive to light.   Cardiovascular:      Rate and Rhythm: Normal rate and regular rhythm.   Pulmonary:      Breath sounds: No wheezing or rales.   Abdominal:      General: Bowel sounds are normal.      Palpations: Abdomen is soft.      Tenderness: There is no abdominal tenderness.   Musculoskeletal:      Cervical back: Neck supple.      Right lower leg: No edema.      Left lower leg: No edema.   Lymphadenopathy:      Cervical: No cervical adenopathy.   Skin:     General: Skin is warm.      Findings: No rash.   Neurological:      Mental Status: He is alert and oriented to person, place, and time.      Cranial Nerves: No cranial nerve deficit.   Psychiatric:         Behavior: Behavior normal.         Assessment and Plan  Shaun Lopez was seen today for lab results.    Elevated LFTs  -     CBC; Future  -     COMPREHENSIVE METABOLIC PNL, FASTING; Future  -     GAMMA GT; Future  -     HEPATITIS A IGM ANTIBODY; Future  -     HEPATITIS B SURFACE ANTIGEN; Future  -     HEPATITIS B CORE IGM, AB; Future  -     HEPATITIS C ANTIBODY SCREEN WITH REFLEX TO HCV PCR; Future    Iron deficiency anemia    Myalgia    DDD (degenerative disc disease), lumbar    Other orders  -     rosuvastatin (CRESTOR) 20 mg Oral Tablet; Take 20 mg by mouth Once a day    He  has already had a statin rotated.  He may benefit from a complete statin holiday based on his numbers.  Will get hepatitis panel GGT and consider ultrasound if numbers are still up with the liver.  It may be a med effect that is sent him into a little bit of a tail spin in terms of his body symptoms.  Further plans based on number trend.  May include Gastroenterology or rheumatology in the future.    Return in about 3 months (around 06/12/2021).    Hollace Kinnier, MD

## 2021-03-14 ENCOUNTER — Telehealth (INDEPENDENT_AMBULATORY_CARE_PROVIDER_SITE_OTHER): Payer: Self-pay | Admitting: Family Medicine

## 2021-03-14 NOTE — Telephone Encounter (Signed)
Spoke with patient on the phone regarding blood work results. Voiced understanding.

## 2021-03-14 NOTE — Telephone Encounter (Signed)
-----   Message from Hollace Kinnier, MD sent at 03/14/2021  8:37 AM EDT -----  Liver enzymes have normalized.  Hepatitis labs are negative.  GGT is only lab that is elevated which is nonspecific.  We use it to sometimes evaluate the liver system.  It can be isolated elevation in alcohol use.

## 2021-03-16 ENCOUNTER — Encounter (HOSPITAL_COMMUNITY): Payer: Self-pay

## 2021-03-16 ENCOUNTER — Other Ambulatory Visit (HOSPITAL_COMMUNITY): Payer: Self-pay | Admitting: *Deleted

## 2021-03-16 DIAGNOSIS — R079 Chest pain, unspecified: Secondary | ICD-10-CM

## 2021-03-20 ENCOUNTER — Encounter (HOSPITAL_BASED_OUTPATIENT_CLINIC_OR_DEPARTMENT_OTHER): Payer: Self-pay | Admitting: *Deleted

## 2021-03-20 ENCOUNTER — Ambulatory Visit (HOSPITAL_COMMUNITY): Payer: 59

## 2021-03-20 ENCOUNTER — Telehealth: Payer: Self-pay | Admitting: Cardiology

## 2021-03-20 NOTE — Telephone Encounter (Signed)
Spoke with patient and apologized for inconvenience   Patient was seen by his PCP since seen by Lise Auer PA   After talking with patient he would like to just keep visit with Dr Jens Som tomorrow to discuss if still needs cardiac CT

## 2021-03-20 NOTE — Telephone Encounter (Signed)
Patient states he was notified that his CT on today, 10/04, was rescheduled for 10/07. He states he contacted [facility] and was notified that the appointment was cancelled by "patient." He states he spoke with someone else who told him our office changed the order and that is why his appointment was rescheduled. He is hopeful to speak with Dr. Ludwig Clarks nurse ASAP for clarification. He states he never rescehduled the appointment.

## 2021-03-21 ENCOUNTER — Telehealth (HOSPITAL_COMMUNITY): Payer: Self-pay | Admitting: *Deleted

## 2021-03-21 ENCOUNTER — Encounter: Payer: Self-pay | Admitting: Cardiology

## 2021-03-21 ENCOUNTER — Other Ambulatory Visit: Payer: Self-pay

## 2021-03-21 ENCOUNTER — Ambulatory Visit (INDEPENDENT_AMBULATORY_CARE_PROVIDER_SITE_OTHER): Payer: 59 | Admitting: Cardiology

## 2021-03-21 VITALS — BP 112/60 | HR 50 | Ht 71.0 in | Wt 225.1 lb

## 2021-03-21 DIAGNOSIS — I351 Nonrheumatic aortic (valve) insufficiency: Secondary | ICD-10-CM | POA: Diagnosis not present

## 2021-03-21 DIAGNOSIS — I480 Paroxysmal atrial fibrillation: Secondary | ICD-10-CM | POA: Diagnosis not present

## 2021-03-21 DIAGNOSIS — R079 Chest pain, unspecified: Secondary | ICD-10-CM | POA: Diagnosis not present

## 2021-03-21 DIAGNOSIS — I77810 Thoracic aortic ectasia: Secondary | ICD-10-CM

## 2021-03-21 NOTE — Telephone Encounter (Signed)
Reaching out to patient to offer assistance regarding upcoming cardiac imaging study; pt verbalizes understanding of appt date/time, parking situation and where to check in, pre-test NPO status and medications ordered, and verified current allergies; name and call back number provided for further questions should they arise  Larey Brick RN Navigator Cardiac Imaging [facility] [phone] office [phone] cell  Patient to take his daily metoprolol succinate for test.  He reports he doesn't think he has any allergy to iodine.  He previously had a nuclear stress test in which he reacted to and thought that substance contained iodine.

## 2021-03-21 NOTE — Patient Instructions (Addendum)
   Your cardiac CT will be scheduled at one of the below locations:   [facility] [address] [place], [place] [address] [phone]   If scheduled at [facility], please arrive at the [facility] main entrance (entrance A) of [facility] 30 minutes prior to test start time. Proceed to the [facility] Radiology Department (first floor) to check-in and test prep.   Please follow these instructions carefully (unless otherwise directed):  Hold all erectile dysfunction medications at least 3 days (72 hrs) prior to test.  On the Night Before the Test: Be sure to Drink plenty of water. Do not consume any caffeinated/decaffeinated beverages or chocolate 12 hours prior to your test. Do not take any antihistamines 12 hours prior to your test.   On the Day of the Test: Drink plenty of water until 1 hour prior to the test. Do not eat any food 4 hours prior to the test. You may take your regular medications prior to the test.  TAKE METOPROLOL SUCC 25 MG EARLY THE MORNING PRIOR TO CT SCAN HOLD Furosemide/Hydrochlorothiazide morning of the test. FEMALES- please wear underwire-free bra if available, avoid dresses & tight clothing  After the Test: Drink plenty of water. After receiving IV contrast, you may experience a mild flushed feeling. This is normal. On occasion, you may experience a mild rash up to 24 hours after the test. This is not dangerous. If this occurs, you can take Benadryl 25 mg and increase your fluid intake. If you experience trouble breathing, this can be serious. If it is severe call 911 IMMEDIATELY. If it is mild, please call our office. If you take any of these medications: Glipizide/Metformin, Avandament, Glucavance, please do not take 48 hours after completing test unless otherwise instructed.  Please allow 2-4 weeks for scheduling of routine cardiac CTs. Some insurance companies require a pre-authorization which may delay scheduling of  this test.   For non-scheduling related questions, please contact the cardiac imaging nurse navigator should you have any questions/concerns: Rockwell Alexandria, Cardiac Imaging Nurse Navigator Larey Brick, Cardiac Imaging Nurse Navigator [facility] Direct Office Dial: [phone]   For scheduling needs, including cancellations and rescheduling, please call [place], [phone].    Follow-Up: At [facility], you and your health needs are our priority.  As part of our continuing mission to provide you with exceptional heart care, we have created designated Provider Care Teams.  These Care Teams include your primary Cardiologist (physician) and Advanced Practice Providers (APPs -  Physician Assistants and Nurse Practitioners) who all work together to provide you with the care you need, when you need it.  We recommend signing up for the patient portal called "MyChart".  Sign up information is provided on this After Visit Summary.  MyChart is used to connect with patients for Virtual Visits (Telemedicine).  Patients are able to view lab/test results, encounter notes, upcoming appointments, etc.  Non-urgent messages can be sent to your provider as well.   To learn more about what you can do with MyChart, go to [email or web address].    Your next appointment:   6 month(s)  The format for your next appointment:   In Person  Provider:   Olga Millers, MD

## 2021-03-23 ENCOUNTER — Ambulatory Visit (HOSPITAL_COMMUNITY)
Admission: RE | Admit: 2021-03-23 | Discharge: 2021-03-23 | Disposition: A | Discharge status: Home / Self Care | Payer: 59 | Source: Ambulatory Visit | Attending: Physician Assistant | Admitting: Physician Assistant

## 2021-03-23 ENCOUNTER — Other Ambulatory Visit: Payer: Self-pay

## 2021-03-23 ENCOUNTER — Encounter (HOSPITAL_COMMUNITY): Payer: Self-pay

## 2021-03-23 DIAGNOSIS — R079 Chest pain, unspecified: Secondary | ICD-10-CM | POA: Diagnosis present

## 2021-03-23 MED ORDER — IOHEXOL 350 MG/ML SOLN
100.0000 mL | Freq: Once | INTRAVENOUS | Status: AC | PRN
  Administered 2021-03-23: 100 mL via INTRAVENOUS

## 2021-03-23 MED ORDER — NITROGLYCERIN 0.4 MG SL SUBL
0.8000 mg | SUBLINGUAL_TABLET | Freq: Once | SUBLINGUAL | Status: AC
  Administered 2021-03-23: 0.8 mg via SUBLINGUAL

## 2021-03-23 MED ORDER — NITROGLYCERIN 0.4 MG SL SUBL
SUBLINGUAL_TABLET | SUBLINGUAL | Status: AC
  Filled 2021-03-23: qty 2

## 2021-03-26 ENCOUNTER — Ambulatory Visit: Payer: 59 | Admitting: Pulmonary Disease

## 2021-03-28 ENCOUNTER — Other Ambulatory Visit: Payer: Self-pay | Admitting: Physician Assistant

## 2021-03-28 ENCOUNTER — Telehealth: Payer: Self-pay | Admitting: Physician Assistant

## 2021-03-28 ENCOUNTER — Telehealth: Payer: Self-pay

## 2021-03-28 DIAGNOSIS — I7121 Aneurysm of the ascending aorta, without rupture: Secondary | ICD-10-CM

## 2021-03-28 MED ORDER — METOPROLOL SUCCINATE ER 50 MG PO TB24: 50.0000 mg | ORAL_TABLET | Freq: Every day | ORAL | 3 refills | Status: AC

## 2021-03-28 NOTE — Telephone Encounter (Signed)
Spoke to patient re: CT results.  See result management.    Then spoke to patient about his statin intolerance.  He states he had arm cramping at night.  He is unsure if this is related to his chronic back issues or statin therapy.  He had tried Crestor 20 mg daily for 1 week.  Blood work showed mildly elevated liver enzymes with ALT 75 AST 62 and alkaline phosphatase 200 but also in the setting of tylenol use.  The plan was to keep him off statins for 3 weeks and see if his myalgias improved.  He does think his leg pain is improving some.  He is seeing a [facility].  He is worried about fam hx of RA.    When he is ready to restart statin therapy, recommend trialing half dose of Crestor 20 mg daily by the new year.  If he can find a regimen that he can tolerate by January, then we can recheck lipids at his follow-up appointment in April.

## 2021-03-28 NOTE — Telephone Encounter (Addendum)
Patient  was advise of Ct Scan results ----- Message from Cannon Kettle, PA-C sent at 03/28/2021 10:38 AM EDT ----- Coronary calcium score is 0.  No significant plaque in any of the 3 main vessels. Moderate aortic dilation 45 mm and ascending aorta.  Echo 1 year ago estimated aortic dilation 40 mm. Recommendations for repeat CT angio chest aorta in 6 months - order placed.

## 2021-04-18 ENCOUNTER — Ambulatory Visit: Payer: 59 | Admitting: Pulmonary Disease

## 2021-04-20 ENCOUNTER — Ambulatory Visit: Payer: 59 | Admitting: Pulmonary Disease

## 2021-05-15 ENCOUNTER — Ambulatory Visit: Payer: 59 | Admitting: Pulmonary Disease

## 2021-06-14 ENCOUNTER — Telehealth (INDEPENDENT_AMBULATORY_CARE_PROVIDER_SITE_OTHER): Payer: Self-pay | Admitting: Family Medicine

## 2021-06-14 NOTE — Telephone Encounter (Signed)
Informed patient of recomendations

## 2021-06-14 NOTE — Telephone Encounter (Signed)
Would need to check with his insurance and billing to answer these questions.

## 2021-06-14 NOTE — Telephone Encounter (Signed)
Pt is calling has some question about why he got charged for his blood work     [phone]

## 2021-06-15 ENCOUNTER — Encounter (INDEPENDENT_AMBULATORY_CARE_PROVIDER_SITE_OTHER): Payer: Self-pay | Admitting: Family Medicine

## 2021-06-17 NOTE — Progress Notes (Deleted)
° ° ° ° °  HPI:FU paroxysmal atrial fibrillation. Note he had a prolonged vagal episode following an exercise treadmill requiring transient CPR in the past. Echocardiogram September 2021 showed ejection fraction 55 to 60%, mild left ventricular hypertrophy, grade 1 diastolic dysfunction, mild aortic insufficiency and mildly dilated ascending aorta at 40 mm.  Cardiac CTA October 2022 showed calcium score 0, no coronary disease, PFO, moderately dilated ascending aorta at 45 mm and minimal calcification of the aortic valve (23).  Since I last saw him,   Current Outpatient Medications  Medication Sig Dispense Refill   aspirin 81 MG tablet Take 81 mg by mouth daily.     lansoprazole (PREVACID) 15 MG capsule Take 15 mg by mouth daily at 12 noon.     metoprolol succinate (TOPROL-XL) 50 MG 24 hr tablet Take 1 tablet (50 mg total) by mouth daily. 90 tablet 3   rosuvastatin (CRESTOR) 20 MG tablet Take 1 tablet (20 mg total) by mouth daily. (Patient not taking: Reported on 03/21/2021) 90 tablet 3   No current facility-administered medications for this visit.     Past Medical History:  Diagnosis Date   GERD    HYPERLIPIDEMIA    OSA (obstructive sleep apnea) 01/02/2018   PAROXYSMAL ATRIAL FIBRILLATION    Syncope and collapse     Past Surgical History:  Procedure Laterality Date   TONSILLECTOMY      Social History   Socioeconomic History   Marital status: Single    Spouse name: Not on file   Number of children: Not on file   Years of education: Not on file   Highest education level: Not on file  Occupational History   Not on file  Tobacco Use   Smoking status: Never   Smokeless tobacco: Never  Vaping Use   Vaping Use: Never used  Substance and Sexual Activity   Alcohol use: No   Drug use: No   Sexual activity: Not on file  Other Topics Concern   Not on file  Social History Narrative   Not on file   Social Determinants of Health   Financial Resource Strain: Not on file  Food  Insecurity: Not on file  Transportation Needs: Not on file  Physical Activity: Not on file  Stress: Not on file  Social Connections: Not on file  Intimate Partner Violence: Not on file    Family History  Problem Relation Age of Onset   Heart attack Father    Coronary artery disease Other     ROS: no fevers or chills, productive cough, hemoptysis, dysphasia, odynophagia, melena, hematochezia, dysuria, hematuria, rash, seizure activity, orthopnea, PND, pedal edema, claudication. Remaining systems are negative.  Physical Exam: Well-developed well-nourished in no acute distress.  Skin is warm and dry.  HEENT is normal.  Neck is supple.  Chest is clear to auscultation with normal expansion.  Cardiovascular exam is regular rate and rhythm.  Abdominal exam nontender or distended. No masses palpated. Extremities show no edema. neuro grossly intact  ECG- personally reviewed  A/P  1 paroxysmal atrial fibrillation-patient is in sinus rhythm on examination today.  Continue beta-blocker.  CHA2DS2-VASc is 0 and therefore we are not anticoagulated.  2 thoracic aortic aneurysm-he will need follow-up CTA April 2023.  3 aortic insufficiency-mild on most recent echocardiogram.  4 hyperlipidemia-patient had elevated liver functions previously.  Kirk Ruths, MD

## 2021-06-27 ENCOUNTER — Encounter: Payer: Self-pay | Admitting: Cardiology

## 2021-06-27 ENCOUNTER — Other Ambulatory Visit: Payer: Self-pay

## 2021-06-27 ENCOUNTER — Ambulatory Visit: Payer: 59 | Admitting: Cardiology

## 2021-06-27 ENCOUNTER — Ambulatory Visit (INDEPENDENT_AMBULATORY_CARE_PROVIDER_SITE_OTHER): Payer: Self-pay | Admitting: Cardiology

## 2021-06-27 VITALS — BP 130/80 | HR 64 | Ht 71.0 in | Wt 231.4 lb

## 2021-06-27 DIAGNOSIS — E7800 Pure hypercholesterolemia, unspecified: Secondary | ICD-10-CM

## 2021-06-27 DIAGNOSIS — I351 Nonrheumatic aortic (valve) insufficiency: Secondary | ICD-10-CM

## 2021-06-27 DIAGNOSIS — D508 Other iron deficiency anemias: Secondary | ICD-10-CM

## 2021-06-27 DIAGNOSIS — I7121 Aneurysm of the ascending aorta, without rupture: Secondary | ICD-10-CM

## 2021-06-27 DIAGNOSIS — I480 Paroxysmal atrial fibrillation: Secondary | ICD-10-CM

## 2021-06-27 NOTE — Patient Instructions (Signed)
°  Lab Work: ° °Your physician recommends that you return for lab work FASTING ° °If you have labs (blood work) drawn today and your tests are completely normal, you will receive your results only by: °MyChart Message (if you have MyChart) OR °A paper copy in the mail °If you have any lab test that is abnormal or we need to change your treatment, we will call you to review the results. ° ° °Follow-Up: °At [facility], you and your health needs are our priority.  As part of our continuing mission to provide you with exceptional heart care, we have created designated Provider Care Teams.  These Care Teams include your primary Cardiologist (physician) and Advanced Practice Providers (APPs -  Physician Assistants and Nurse Practitioners) who all work together to provide you with the care you need, when you need it. ° °We recommend signing up for the patient portal called "MyChart".  Sign up information is provided on this After Visit Summary.  MyChart is used to connect with patients for Virtual Visits (Telemedicine).  Patients are able to view lab/test results, encounter notes, upcoming appointments, etc.  Non-urgent messages can be sent to your provider as well.   °To learn more about what you can do with MyChart, go to [email or web address].   ° °Your next appointment:   °6 month(s) ° °The format for your next appointment:   °In Person ° °Provider:   °Brian Crenshaw, MD   ° ° °

## 2021-06-27 NOTE — Progress Notes (Signed)
HPI: FU paroxysmal atrial fibrillation. Note he had a prolonged vagal episode following an exercise treadmill requiring transient CPR in the past. Echocardiogram September 2021 showed ejection fraction 55 to 60%, mild left ventricular hypertrophy, grade 1 diastolic dysfunction, mild aortic insufficiency and mildly dilated ascending aorta at 40 mm.  Cardiac CTA October 2022 showed calcium score 0, moderate ascending aorta dilatation at 45 mm; aortic atherosclerosis.  Since I last saw him, he denies dyspnea, chest pain, palpitations or syncope.  Current Outpatient Medications  Medication Sig Dispense Refill   aspirin 81 MG tablet Take 81 mg by mouth daily.     lansoprazole (PREVACID) 15 MG capsule Take 15 mg by mouth daily at 12 noon.     metoprolol succinate (TOPROL-XL) 50 MG 24 hr tablet Take 1 tablet (50 mg total) by mouth daily. 90 tablet 3   rosuvastatin (CRESTOR) 20 MG tablet Take 1 tablet (20 mg total) by mouth daily. (Patient not taking: Reported on 03/21/2021) 90 tablet 3   No current facility-administered medications for this visit.     Past Medical History:  Diagnosis Date   GERD    HYPERLIPIDEMIA    OSA (obstructive sleep apnea) 01/02/2018   PAROXYSMAL ATRIAL FIBRILLATION    Syncope and collapse     Past Surgical History:  Procedure Laterality Date   TONSILLECTOMY      Social History   Socioeconomic History   Marital status: Single    Spouse name: Not on file   Number of children: Not on file   Years of education: Not on file   Highest education level: Not on file  Occupational History   Not on file  Tobacco Use   Smoking status: Never   Smokeless tobacco: Never  Vaping Use   Vaping Use: Never used  Substance and Sexual Activity   Alcohol use: No   Drug use: No   Sexual activity: Not on file  Other Topics Concern   Not on file  Social History Narrative   Not on file   Social Determinants of Health   Financial Resource Strain: Not on file  Food  Insecurity: Not on file  Transportation Needs: Not on file  Physical Activity: Not on file  Stress: Not on file  Social Connections: Not on file  Intimate Partner Violence: Not on file    Family History  Problem Relation Age of Onset   Heart attack Father    Coronary artery disease Other     ROS: no fevers or chills, productive cough, hemoptysis, dysphasia, odynophagia, melena, hematochezia, dysuria, hematuria, rash, seizure activity, orthopnea, PND, pedal edema, claudication. Remaining systems are negative.  Physical Exam: Well-developed well-nourished in no acute distress.  Skin is warm and dry.  HEENT is normal.  Neck is supple.  Chest is clear to auscultation with normal expansion.  Cardiovascular exam is regular rate and rhythm.  1/6 systolic murmur left sternal border. Abdominal exam nontender or distended. No masses palpated. Extremities show no edema. neuro grossly intact   A/P  1 paroxysmal atrial fibrillation-patient is in sinus rhythm today on examination.  We will continue Toprol at present dose.  CHA2DS2-VASc is 0 and has therefore not anticoagulated.  2 thoracic aortic aneurysm-he will need follow-up CTA April 2023.  3 aortic insufficiency-mild on most recent echocardiogram.  He will need follow-up studies in the future.  4 hyperlipidemia-patient had elevated liver functions previously.  We will plan to check lipids, liver, GGT and 5 prime nucleotidase.  We  will make a decision concerning resuming the statin at that time.  Note his calcium score previously was 0 with no coronary disease.  5 history of anemia-we will plan to recheck hemoglobin and ferritin.   Kirk Ruths, MD

## 2021-08-02 ENCOUNTER — Other Ambulatory Visit (INDEPENDENT_AMBULATORY_CARE_PROVIDER_SITE_OTHER): Payer: Self-pay | Admitting: Family Medicine

## 2021-09-03 ENCOUNTER — Encounter: Payer: Self-pay | Admitting: *Deleted

## 2021-09-19 ENCOUNTER — Other Ambulatory Visit: Payer: Self-pay | Admitting: *Deleted

## 2021-09-19 DIAGNOSIS — I7121 Aneurysm of the ascending aorta, without rupture: Secondary | ICD-10-CM

## 2021-10-03 ENCOUNTER — Encounter: Payer: Self-pay | Admitting: *Deleted

## 2021-10-08 ENCOUNTER — Other Ambulatory Visit: Payer: Self-pay

## 2021-10-08 ENCOUNTER — Encounter: Payer: Self-pay | Admitting: *Deleted

## 2021-10-08 MED ORDER — ROSUVASTATIN CALCIUM 20 MG PO TABS: 20.0000 mg | ORAL_TABLET | Freq: Every day | ORAL | 2 refills | Status: DC

## 2021-10-15 ENCOUNTER — Ambulatory Visit: Payer: Self-pay | Admitting: Pulmonary Disease

## 2021-10-25 ENCOUNTER — Encounter (INDEPENDENT_AMBULATORY_CARE_PROVIDER_SITE_OTHER): Payer: Self-pay | Admitting: Family Medicine

## 2021-10-30 ENCOUNTER — Encounter (INDEPENDENT_AMBULATORY_CARE_PROVIDER_SITE_OTHER): Payer: No Typology Code available for payment source | Admitting: Family Medicine

## 2021-11-23 LAB — CBC
Hematocrit: 39.4 % (ref 37.5–51.0)
Hemoglobin: 14 g/dL (ref 13.0–17.7)
MCH: 30.4 pg (ref 26.6–33.0)
MCHC: 35.5 g/dL (ref 31.5–35.7)
MCV: 86 fL (ref 79–97)
Platelets: 208 10*3/uL (ref 150–450)
RBC: 4.6 x10E6/uL (ref 4.14–5.80)
RDW: 12.3 % (ref 11.6–15.4)
WBC: 6 10*3/uL (ref 3.4–10.8)

## 2021-11-23 LAB — LIPID PANEL
Chol/HDL Ratio: 3.9 ratio (ref 0.0–5.0)
Cholesterol, Total: 205 mg/dL — ABNORMAL HIGH (ref 100–199)
HDL: 52 mg/dL (ref >39)
LDL Chol Calc (NIH): 134 mg/dL — ABNORMAL HIGH (ref 0–99)
Triglycerides: 107 mg/dL (ref 0–149)
VLDL Cholesterol Cal: 19 mg/dL (ref 5–40)

## 2021-11-23 LAB — HEPATIC FUNCTION PANEL
ALT: 18 IU/L (ref 0–44)
AST: 18 IU/L (ref 0–40)
Albumin: 4.1 g/dL (ref 3.8–4.8)
Alkaline Phosphatase: 90 IU/L (ref 44–121)
Bilirubin Total: 0.6 mg/dL (ref 0.0–1.2)
Bilirubin, Direct: 0.14 mg/dL (ref 0.00–0.40)
Total Protein: 6.3 g/dL (ref 6.0–8.5)

## 2021-11-23 LAB — GAMMA GT: GGT: 54 IU/L (ref 0–65)

## 2021-11-23 LAB — FERRITIN: Ferritin: 219 ng/mL (ref 30–400)

## 2021-11-23 LAB — NUCLEOTIDASE, 5', BLOOD: 5-Nucleotidase: 4 IU/L (ref 0–11)

## 2021-11-29 NOTE — Progress Notes (Signed)
HPI: FU paroxysmal atrial fibrillation. Note he had a prolonged vagal episode following an exercise treadmill requiring transient CPR in the past. Echocardiogram September 2021 showed ejection fraction 55 to 60%, mild left ventricular hypertrophy, grade 1 diastolic dysfunction, mild aortic insufficiency and mildly dilated ascending aorta at 40 mm.  Cardiac CTA October 2022 showed calcium score 0, moderate ascending aorta dilatation at 45 mm; aortic atherosclerosis.  Since I last saw him, there is no dyspnea, chest pain, palpitations or syncope.  Current Outpatient Medications  Medication Sig Dispense Refill   aspirin 81 MG tablet Take 81 mg by mouth daily.     lansoprazole (PREVACID) 15 MG capsule Take 15 mg by mouth daily at 12 noon.     metoprolol succinate (TOPROL-XL) 50 MG 24 hr tablet Take 1 tablet (50 mg total) by mouth daily. 90 tablet 3   rosuvastatin (CRESTOR) 20 MG tablet Take 1 tablet (20 mg total) by mouth daily. 30 tablet 2   No current facility-administered medications for this visit.     Past Medical History:  Diagnosis Date   GERD    HYPERLIPIDEMIA    OSA (obstructive sleep apnea) 01/02/2018   PAROXYSMAL ATRIAL FIBRILLATION    Syncope and collapse     Past Surgical History:  Procedure Laterality Date   TONSILLECTOMY      Social History   Socioeconomic History   Marital status: Single    Spouse name: Not on file   Number of children: Not on file   Years of education: Not on file   Highest education level: Not on file  Occupational History   Not on file  Tobacco Use   Smoking status: Never   Smokeless tobacco: Never  Vaping Use   Vaping Use: Never used  Substance and Sexual Activity   Alcohol use: No   Drug use: No   Sexual activity: Not on file  Other Topics Concern   Not on file  Social History Narrative   Not on file   Social Determinants of Health   Financial Resource Strain: Not on file  Food Insecurity: Not on file  Transportation  Needs: Not on file  Physical Activity: Not on file  Stress: Not on file  Social Connections: Not on file  Intimate Partner Violence: Not on file    Family History  Problem Relation Age of Onset   Heart attack Father    Coronary artery disease Other     ROS: no fevers or chills, productive cough, hemoptysis, dysphasia, odynophagia, melena, hematochezia, dysuria, hematuria, rash, seizure activity, orthopnea, PND, pedal edema, claudication. Remaining systems are negative.  Physical Exam: Well-developed well-nourished in no acute distress.  Skin is warm and dry.  HEENT is normal.  Neck is supple.  Chest is clear to auscultation with normal expansion.  Cardiovascular exam is regular rate and rhythm.  Abdominal exam nontender or distended. No masses palpated. Extremities show no edema. neuro grossly intact  ECG-sinus bradycardia at a rate of 51, left axis deviation.  Personally reviewed  A/P  1 paroxysmal atrial fibrillation-CHA2DS2-VASc is 0; thus patient not on anticoagulation.  Continue Toprol.  2 thoracic aortic aneurysm-we will arrange follow-up CTA to further assess.  3 aortic insufficiency-mild on most recent echocardiogram.  4 hyperlipidemia-follow-up lipids in June showed LDL 134.  LFTs had normalized, 5 prime nucleotidase and GGT normal.  His elevated liver functions were on Lipitor 40 mg daily previously.  We will try 20 mg to see if he tolerates.  Check lipids and liver in 8 weeks.  5 history of anemia-follow-up hemoglobin 14 with normal ferritin.  Olga Millers, MD

## 2021-12-05 ENCOUNTER — Ambulatory Visit (INDEPENDENT_AMBULATORY_CARE_PROVIDER_SITE_OTHER): Payer: Self-pay | Admitting: Family Medicine

## 2021-12-05 ENCOUNTER — Encounter (INDEPENDENT_AMBULATORY_CARE_PROVIDER_SITE_OTHER): Payer: Self-pay | Admitting: Family Medicine

## 2021-12-05 VITALS — BP 102/62 | HR 58 | Temp 97.3°F | Resp 16 | Ht 71.0 in | Wt 224.8 lb

## 2021-12-05 DIAGNOSIS — S83412A Sprain of medial collateral ligament of left knee, initial encounter: Secondary | ICD-10-CM

## 2021-12-05 DIAGNOSIS — E7800 Pure hypercholesterolemia, unspecified: Secondary | ICD-10-CM

## 2021-12-05 DIAGNOSIS — I480 Paroxysmal atrial fibrillation: Secondary | ICD-10-CM

## 2021-12-05 DIAGNOSIS — M23305 Other meniscus derangements, unspecified medial meniscus, unspecified knee: Secondary | ICD-10-CM

## 2021-12-05 DIAGNOSIS — D509 Iron deficiency anemia, unspecified: Secondary | ICD-10-CM

## 2021-12-05 NOTE — Progress Notes (Signed)
[facility]  [place] [address]        Encounter Date: 12/05/2021  9:45 AM EDT      Name: Shaun Lopez Shaun Lopez  Age: 61 y.o.  DOB: Mar 12, 1961  Sex: male    Chief Complaint:   Chief Complaint   Patient presents with   . Knee Pain       HPI   Patient here for follow-up on his hyperlipidemia a checkup in some knee pain has been bothering her recently.  He was doing rotational type of step at work and felt a little bit of a poor pop in his left knee.  Was medially and had a little bit of stiffness afterwards.  He actually hurt more after car ride to visit his mother and spending some time sitting still.  He has been icing it doing some range of motion in his seems to slowly be getting better.  He does not have any instability in notice going up and down steps was a little more challenging.  He did see a cardiologist in his hometown who updated calcium score that was 0 he has been talking about coming off of the statins have been off of them for little bit.  He was having some myalgia in their seeing of was related to that.  He also had discover that drinking a lot of tea may have been affecting some of his absorption of his iron and his numbers have normalized now with replacement he has had no melanotic stools and no hemoptysis.    History   Family Medical History:     Problem Relation (Age of Onset)    Arthritis-rheumatoid Mother    Cancer Sister    Colon Cancer Maternal Grandfather    Heart Attack Father    Hypertension (High Blood Pressure) Mother    Other Mother        Past Medical History:   Diagnosis Date   . Atrial fibrillation (CMS HCC)    . Cancer (CMS [place])     skin 2007   . Esophageal reflux    . High cholesterol    . Melanoma (CMS [place])     2007   . Varicella      Past Surgical History:   Procedure Laterality Date   . COLONOSCOPY     . HX OTHER      Melanoma removal   . HX TONSILLECTOMY     . HX WISDOM TEETH EXTRACTION       Patient Active Problem List    Diagnosis   . DDD  (degenerative disc disease), lumbar   . Iron deficiency anemia   . OSA (obstructive sleep apnea)   . PAF (paroxysmal atrial fibrillation) (CMS HCC)   . GERD (gastroesophageal reflux disease)   . Pure hypercholesterolemia     Current Outpatient Medications   Medication Sig   . aspirin (ECOTRIN) 81 mg Oral Tablet, Delayed Release (E.C.) Take 81 mg by mouth Once a day   . LANSOPRAZOLE (PREVACID ORAL) Take 40 mg by mouth Once a day   . metoprolol succinate (TOPROL-XL) 50 mg Oral Tablet Sustained Release 24 hr TAKE ONE TABLET EVERY DAY   . polysaccharide iron complex (FERREX 150) 150 mg iron Oral Capsule Take 1 Capsule (150 mg total) by mouth Twice daily for 180 days   . rosuvastatin (CRESTOR) 20 mg Oral Tablet Take 20 mg by mouth Once a day     Social History  Tobacco Use   Smoking Status Never   Smokeless Tobacco Never     Social History     Substance and Sexual Activity   Alcohol Use No       Review of Systems   Constitutional: Negative for chills, fatigue and fever.   HENT: Negative for nosebleeds, sore throat and trouble swallowing.    Eyes: Negative for photophobia and pain.   Respiratory: Negative for cough and wheezing.    Cardiovascular: Negative for chest pain and leg swelling.   Gastrointestinal: Negative for abdominal pain, constipation and diarrhea.   Endocrine: Negative for cold intolerance and heat intolerance.   Genitourinary: Negative for dysuria and urgency.   Musculoskeletal: Negative for back pain and joint swelling.   Neurological: Negative for seizures, syncope and headaches.   Hematological: Negative for adenopathy. Does not bruise/bleed easily.   Psychiatric/Behavioral: Negative for confusion and hallucinations. The patient is not nervous/anxious.         Examination  Vitals: BP 102/62   Pulse 58   Temp 36.3 C (97.3 F)   Resp 16   Ht 1.803 m ('5\' 11"'$ )   Wt 102 kg (224 lb 12.8 oz)   SpO2 97%   BMI 31.35 kg/m         Physical Exam  Constitutional:       Appearance: He is well-developed.    HENT:      Head: Normocephalic and atraumatic.   Eyes:      General: No scleral icterus.     Pupils: Pupils are equal, round, and reactive to light.   Cardiovascular:      Rate and Rhythm: Normal rate and regular rhythm.   Pulmonary:      Breath sounds: No wheezing or rales.   Abdominal:      General: Bowel sounds are normal.      Palpations: Abdomen is soft.      Tenderness: There is no abdominal tenderness.   Musculoskeletal:      Cervical back: Neck supple.   Lymphadenopathy:      Cervical: No cervical adenopathy.   Skin:     General: Skin is warm.      Findings: No rash.   Neurological:      Mental Status: He is alert and oriented to person, place, and time.      Cranial Nerves: No cranial nerve deficit.   Psychiatric:         Behavior: Behavior normal.         Assessment and Plan  Shaun Lopez was seen today for knee pain.    Sprain of medial collateral ligament of left knee, initial encounter    Degeneration of medial meniscus    Pure hypercholesterolemia    Iron deficiency anemia    PAF (paroxysmal atrial fibrillation) (CMS HCC)     We talked about the exam be most consistent with a medial collateral ligament sprain a little bit of meniscal degeneration.  We talked about quad strengthening in things he could do for that.  We are able to review his labs in information through the other health system through epic.  He can follow-up with [place] with 1 year p.r.n. checkup.    Return in about 1 year (around 12/06/2022).    Shaun Kinnier, MD

## 2021-12-12 ENCOUNTER — Ambulatory Visit (INDEPENDENT_AMBULATORY_CARE_PROVIDER_SITE_OTHER): Payer: 59 | Admitting: Cardiology

## 2021-12-12 ENCOUNTER — Encounter: Payer: Self-pay | Admitting: Cardiology

## 2021-12-12 VITALS — BP 110/78 | HR 51 | Ht 71.0 in | Wt 224.1 lb

## 2021-12-12 DIAGNOSIS — I480 Paroxysmal atrial fibrillation: Secondary | ICD-10-CM

## 2021-12-12 DIAGNOSIS — I351 Nonrheumatic aortic (valve) insufficiency: Secondary | ICD-10-CM

## 2021-12-12 DIAGNOSIS — E7800 Pure hypercholesterolemia, unspecified: Secondary | ICD-10-CM

## 2021-12-12 DIAGNOSIS — I7121 Aneurysm of the ascending aorta, without rupture: Secondary | ICD-10-CM

## 2021-12-12 MED ORDER — ATORVASTATIN CALCIUM 20 MG PO TABS: 20.0000 mg | ORAL_TABLET | Freq: Every day | ORAL | 3 refills | Status: DC

## 2021-12-12 NOTE — Patient Instructions (Signed)
Medication Instructions:   START ATORVASTATIN 20 MG ONCE DAILY  *If you need a refill on your cardiac medications before your next appointment, please call your pharmacy*   Lab Work:  Your physician recommends that you return for lab work in: 8 [facility]  If you have labs (blood work) drawn today and your tests are completely normal, you will receive your results only by: MyChart Message (if you have MyChart) OR A paper copy in the mail If you have any lab test that is abnormal or we need to change your treatment, we will call you to review the results.   Testing/Procedures:  CTA OF THE CHEST IN THE [facility] OFFICE-1 ST FLOOR IMAGING DEPARTMENT   Follow-Up: At [facility], you and your health needs are our priority.  As part of our continuing mission to provide you with exceptional heart care, we have created designated Provider Care Teams.  These Care Teams include your primary Cardiologist (physician) and Advanced Practice Providers (APPs -  Physician Assistants and Nurse Practitioners) who all work together to provide you with the care you need, when you need it.  We recommend signing up for the patient portal called "MyChart".  Sign up information is provided on this After Visit Summary.  MyChart is used to connect with patients for Virtual Visits (Telemedicine).  Patients are able to view lab/test results, encounter notes, upcoming appointments, etc.  Non-urgent messages can be sent to your provider as well.   To learn more about what you can do with MyChart, go to [email or web address].    Your next appointment:   12 month(s)  The format for your next appointment:   In Person  Provider:   Olga Millers, MD      Important Information About Sugar

## 2021-12-17 ENCOUNTER — Ambulatory Visit: Payer: Self-pay | Admitting: Pulmonary Disease

## 2021-12-31 ENCOUNTER — Other Ambulatory Visit (INDEPENDENT_AMBULATORY_CARE_PROVIDER_SITE_OTHER): Payer: Self-pay | Admitting: Family Medicine

## 2022-01-10 ENCOUNTER — Ambulatory Visit (HOSPITAL_BASED_OUTPATIENT_CLINIC_OR_DEPARTMENT_OTHER): Admission: RE | Admit: 2022-01-10 | Payer: 59 | Source: Ambulatory Visit

## 2022-01-17 ENCOUNTER — Encounter (HOSPITAL_BASED_OUTPATIENT_CLINIC_OR_DEPARTMENT_OTHER): Payer: Self-pay

## 2022-01-17 ENCOUNTER — Ambulatory Visit (HOSPITAL_BASED_OUTPATIENT_CLINIC_OR_DEPARTMENT_OTHER)
Admission: RE | Admit: 2022-01-17 | Discharge: 2022-01-17 | Disposition: A | Discharge status: Home / Self Care | Payer: 59 | Source: Ambulatory Visit | Attending: Cardiology | Admitting: Cardiology

## 2022-01-17 DIAGNOSIS — I7121 Aneurysm of the ascending aorta, without rupture: Secondary | ICD-10-CM | POA: Diagnosis present

## 2022-01-17 MED ORDER — IOHEXOL 350 MG/ML SOLN
100.0000 mL | Freq: Once | INTRAVENOUS | Status: AC | PRN
  Administered 2022-01-17: 100 mL via INTRAVENOUS

## 2022-01-18 ENCOUNTER — Encounter: Payer: Self-pay | Admitting: Cardiology

## 2022-02-27 ENCOUNTER — Encounter: Payer: Self-pay | Admitting: *Deleted

## 2022-06-13 ENCOUNTER — Other Ambulatory Visit (INDEPENDENT_AMBULATORY_CARE_PROVIDER_SITE_OTHER): Payer: Self-pay | Admitting: Family Medicine

## 2022-06-13 MED ORDER — CEFDINIR 300 MG CAPSULE
300.0000 mg | ORAL_CAPSULE | Freq: Two times a day (BID) | ORAL | 0 refills | Status: DC
Start: 2022-06-13 — End: 2022-06-19

## 2022-06-13 NOTE — Telephone Encounter (Signed)
Spoke to pt and advised of such, voiced understanding.

## 2022-06-13 NOTE — Telephone Encounter (Signed)
Since it sounds localized to the sinuses we can call him in some cefdinir 300 mg twice daily for 10 days.  If he had any body symptoms or fevers and aches would consider expanding out his testing for RSV and flu as these are increasing in the region.

## 2022-06-13 NOTE — Telephone Encounter (Signed)
Has head stuff going on - has an earache and it's into his jaw and head - a little bit of coughing - been this way for 5-6 days - COVID test was negative as of yesterday    CB:  [phone]

## 2022-06-15 IMAGING — CT CT HEART MORP W/ CTA COR W/ SCORE W/ CA W/CM &/OR W/O CM
1 series · 4 of 6 positions shown, 5 images · non-contrast
Comparison: None.
COMPARISON: None.

Addendum:
EXAM:
OVER-READ INTERPRETATION  CT CHEST

The following report is an over-read performed by radiologist Dr.
Klever Samuel Masache [REDACTED] on 03/23/2021. This
over-read does not include interpretation of cardiac or coronary
anatomy or pathology. The coronary calcium score/coronary CTA
interpretation by the cardiologist is attached.
CLINICAL DATA: 60 Year-old White Male
Cardiac/Coronary CTA
TECHNIQUE: The patient was scanned on a Phillips Force scanner.

[Series 2765: — · 0.45mm/px · 4 of 6 slices shown, 5 images]
[im 2/6  vessel]
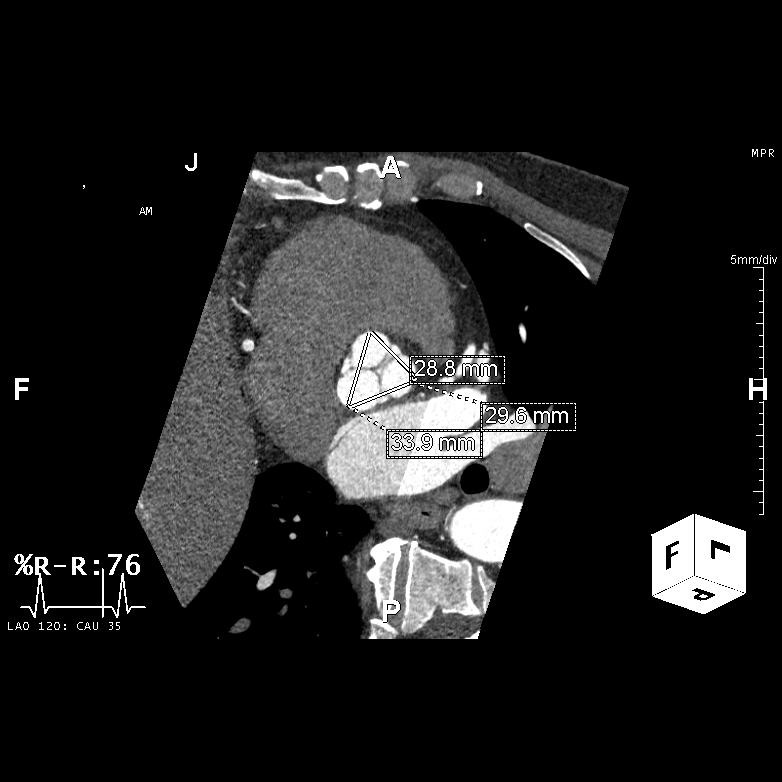
[im 2/6  lung]
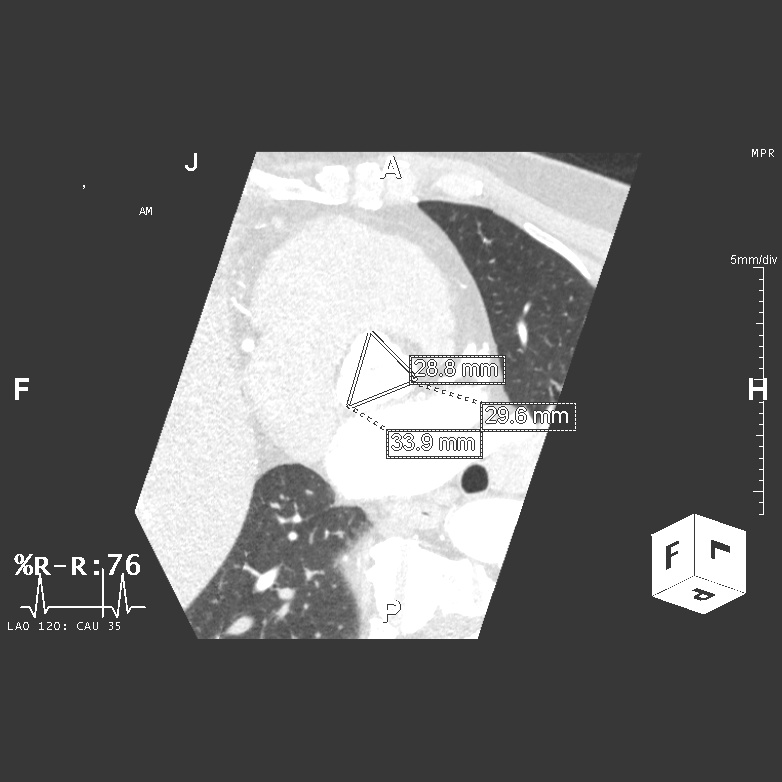
[im 3/6  vessel]
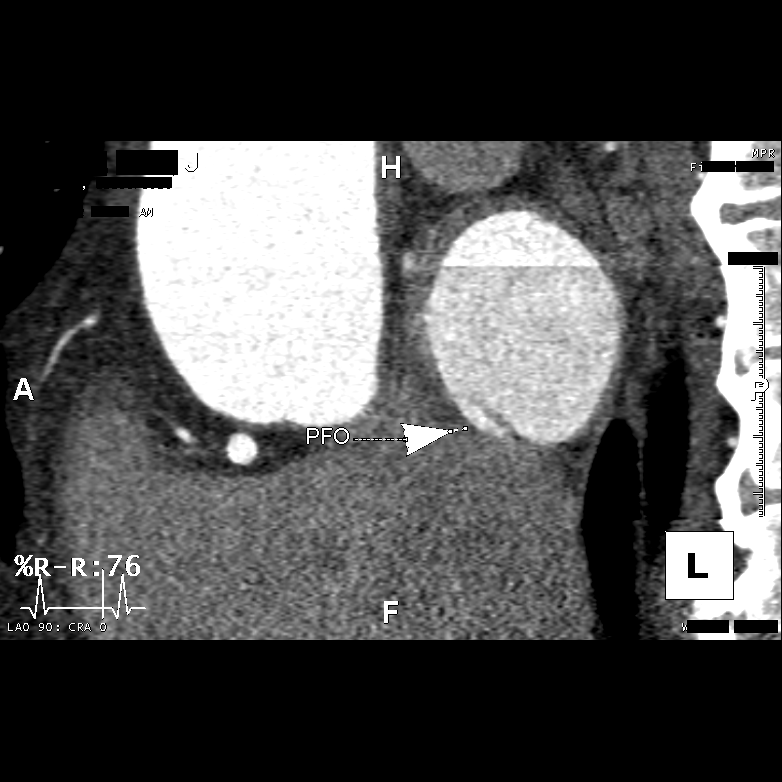
[im 4/6  vessel]
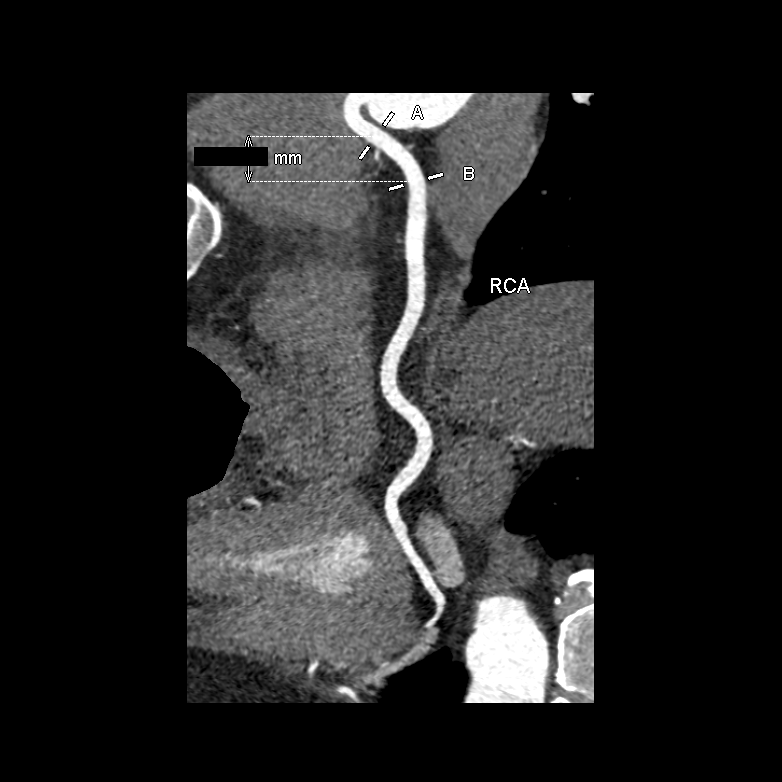
[im 5/6  vessel]
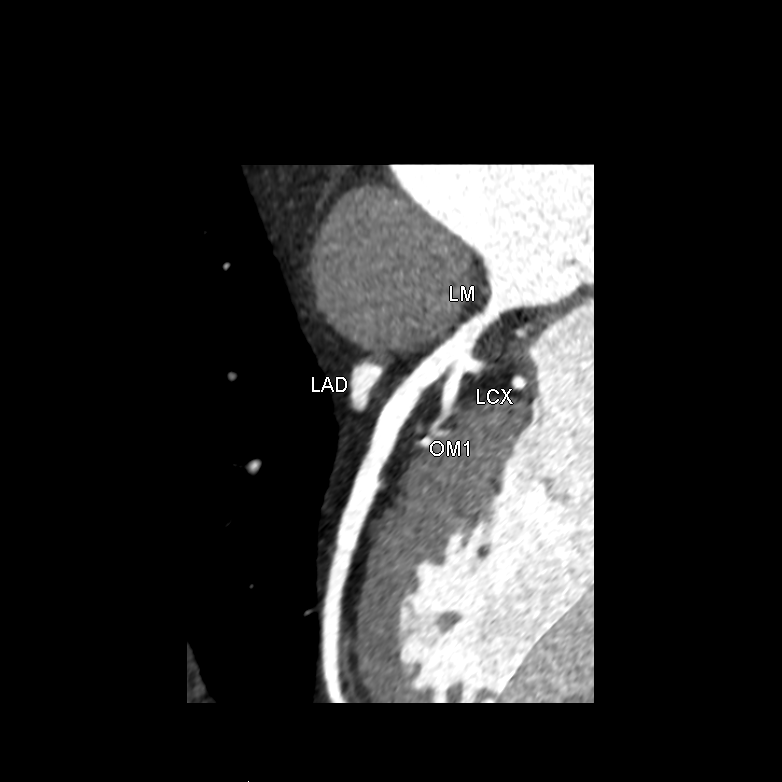

[4 of 6 positions shown; findings below may reference images not displayed]

FINDINGS: Aortic atherosclerosis with aneurysmal dilatation of the ascending
thoracic aorta (4.5 cm in diameter). Within the visualized portions
of the thorax there are no suspicious appearing pulmonary nodules or
masses, there is no acute consolidative airspace disease, no pleural
effusions, no pneumothorax and no lymphadenopathy. Visualized
portions of the upper abdomen are unremarkable. There are no
aggressive appearing lytic or blastic lesions noted in the
visualized portions of the skeleton.
IMPRESSION: 1.  Aortic Atherosclerosis (IBZL4-285.5).
2. Aneurysmal dilatation of the ascending thoracic aorta (4.5 cm in
diameter). Ascending thoracic aortic aneurysm. Recommend semi-annual
imaging followup by CTA or MRA and referral to cardiothoracic
surgery if not already obtained. This recommendation follows 5343
ACCF/AHA/AATS/ACR/ASA/SCA/GIORGI/GAZITUA/TIGER/AMAZIGH Guidelines for the
Diagnosis and Management of Patients With Thoracic Aortic Disease.
Circulation. 5343; 121: E266-e369. Aortic aneurysm NOS
(IBZL4-U2I.R).
FINDINGS: Scan was triggered in the descending thoracic aorta. Axial
non-contrast 3 mm slices were carried out through the heart. The
data set was analyzed on a dedicated work station and scored using
the Agatson method. Gantry rotation speed was 250 msecs and
collimation was .6 mm. 0.8 mg of sl NTG was given. The 3D data set
was reconstructed in 5% intervals of the 67-82 % of the R-R cycle.
Diastolic phases were analyzed on a dedicated work station using
MPR, MIP and VRT modes. The patient received 100 cc of contrast.

Aorta: Moderate aortic dilation 45 mm in the ascending aorta.

Main Pulmonary Artery: Normal size of the pulmonary artery.

Aortic Valve: Tri-leaflet. Minimal calcification with valve calcium
score of 23.

Coronary Arteries:  Normal coronary origin.  Right dominance.

Coronary Calcium Score:

Left main: 0

Left anterior descending artery: 0

Left circumflex artery: 0

Right coronary artery: 0

Total: 0

Percentile: 1st for age, sex, and race matched control.

RCA is a large dominant artery that gives rise to PDA and PLA. There
is no significant plaque.

Left main is a large artery that gives rise to LAD and LCX arteries.
There is no significant plaque.

LAD is a large vessel that gives rise to two diagonal vessels. There
is no significant plaque.

LCX is a non-dominant artery that gives rise to one large OM1 branch
(early take off, not true ramus intermedius). There is no
significant plaque.

Other findings:

Normal pulmonary vein drainage into the left atrium.

Normal left atrial appendage without a thrombus.

Misregistration artifact noted related to cardiac motion.

PFO noted.

Extra-cardiac findings: See attached radiology report for
non-cardiac structures.
IMPRESSION: 1. Coronary calcium score of 0. This was 1st percentile for age,
sex, and race matched control.

2. Normal coronary origin with right dominance.

3. Moderate aortic dilation 45 mm in the ascending aorta.

4. Minimal calcification with aortic valve calcium score of 23.

5. CAD-RADS 0. No evidence of CAD (0%). Consider non-atherosclerotic
causes of chest pain.

RECOMMENDATIONS:



If CAC = 0, it is reasonable to withhold statin therapy and reassess
in 5 to 10 years, as long as higher risk conditions are absent
(diabetes mellitus, family history of premature CHD in first degree
relatives (males <55 years; females <65 years), cigarette smoking,
LDL >=190 mg/dL or other independent risk factors).

If CAC is 1 to 99, it is reasonable to initiate statin therapy for
patients >=55 years of age.

If CAC is >=100 or >=75th percentile, it is reasonable to initiate
statin therapy at any age.

Cardiology referral should be considered for patients with CAC
scores =400 or >=75th percentile.

*6326 AHA/ACC/AACVPR/AAPA/ABC/DWA/NAZARETH/DUTTO/[place]/AMAZIGH/TZ/CEEJAY
Guideline on the Management of Blood Cholesterol: A Report of the
American College of Cardiology/American Heart Association Task Force
on Clinical Practice Guidelines. J Am Coll Cardiol.
6437;73(24):2092-2538.

*** End of Addendum ***
EXAM:
OVER-READ INTERPRETATION  CT CHEST

The following report is an over-read performed by radiologist Dr.
Klever Samuel Masache [REDACTED] on 03/23/2021. This
over-read does not include interpretation of cardiac or coronary
anatomy or pathology. The coronary calcium score/coronary CTA
interpretation by the cardiologist is attached.
FINDINGS: Aortic atherosclerosis with aneurysmal dilatation of the ascending
thoracic aorta (4.5 cm in diameter). Within the visualized portions
of the thorax there are no suspicious appearing pulmonary nodules or
masses, there is no acute consolidative airspace disease, no pleural
effusions, no pneumothorax and no lymphadenopathy. Visualized
portions of the upper abdomen are unremarkable. There are no
aggressive appearing lytic or blastic lesions noted in the
visualized portions of the skeleton.
IMPRESSION: 1.  Aortic Atherosclerosis (IBZL4-285.5).
2. Aneurysmal dilatation of the ascending thoracic aorta (4.5 cm in
diameter). Ascending thoracic aortic aneurysm. Recommend semi-annual
imaging followup by CTA or MRA and referral to cardiothoracic
surgery if not already obtained. This recommendation follows 5343
ACCF/AHA/AATS/ACR/ASA/SCA/GIORGI/GAZITUA/TIGER/AMAZIGH Guidelines for the
Diagnosis and Management of Patients With Thoracic Aortic Disease.
Circulation. 5343; 121: E266-e369. Aortic aneurysm NOS
(IBZL4-U2I.R).

## 2022-06-19 ENCOUNTER — Encounter (INDEPENDENT_AMBULATORY_CARE_PROVIDER_SITE_OTHER): Payer: Self-pay | Admitting: Family Medicine

## 2022-06-19 ENCOUNTER — Ambulatory Visit (INDEPENDENT_AMBULATORY_CARE_PROVIDER_SITE_OTHER): Payer: 59 | Admitting: Family Medicine

## 2022-06-19 VITALS — BP 120/86 | HR 66 | Temp 97.4°F | Resp 17 | Ht 71.0 in | Wt 236.0 lb

## 2022-06-19 DIAGNOSIS — M5481 Occipital neuralgia: Secondary | ICD-10-CM

## 2022-06-19 MED ORDER — METHYLPREDNISOLONE 4 MG TABLETS IN A DOSE PACK
ORAL_TABLET | ORAL | 0 refills | Status: DC
Start: 2022-06-19 — End: 2022-12-06

## 2022-06-19 NOTE — H&P (Signed)
Entered in error

## 2022-06-19 NOTE — Progress Notes (Signed)
[facility]  [place] [id number]        Encounter Date: 06/19/2022  8:15 AM EST      Name: Shaun Lopez  Age: 62 y.o.  DOB: 13-Dec-1960  Sex: male    Chief Complaint:   Chief Complaint   Patient presents with    Head Pain         Head Pain   Associated symptoms include back pain, ear pain and neck pain. Pertinent negatives include no abdominal pain, coughing, eye pain, fever, photophobia, seizures or sore throat.     HPI:   Pt is a 62 y/o male complaining of left scalp and ear pain.  Pain started approximately one week ago which patient describes as tender, non-radiating for which patient was prescribed Patient denies any relieving factors but claims aggravated by brushing hair.   Patient denies fever, chills, conjunctivitis, or discharge from ear/nose. 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          History   Family Medical History:       Problem Relation (Age of Onset)    Arthritis-rheumatoid Mother    Cancer Sister    Colon Cancer Maternal Grandfather    Heart Attack Father    Hypertension (High Blood Pressure) Mother    Other Mother          Past Medical History:   Diagnosis Date    Atrial fibrillation (CMS [place])     Cancer (CMS [place])     skin 2007    Esophageal reflux     High cholesterol     Melanoma (CMS [place])     2007    Varicella        Past Surgical History:   Procedure Laterality Date    COLONOSCOPY      HX OTHER      Melanoma removal    HX TONSILLECTOMY      HX WISDOM TEETH EXTRACTION         Patient Active Problem List    Diagnosis    DDD (degenerative disc disease), lumbar    Iron deficiency anemia    OSA (obstructive sleep apnea)    PAF (paroxysmal atrial fibrillation) (CMS HCC)    GERD (gastroesophageal reflux disease)    Pure hypercholesterolemia     Current Outpatient Medications   Medication Sig    aspirin (ECOTRIN) 81 mg Oral Tablet, Delayed Release (E.C.) Take 81 mg by mouth Once a day    LANSOPRAZOLE (PREVACID ORAL)  Take 40 mg by mouth Once a day    Methylprednisolone (MEDROL DOSEPACK) 4 mg Oral Tablets, Dose Pack Take as instructed.    metoprolol succinate (TOPROL-XL) 50 mg Oral Tablet Sustained Release 24 hr TAKE ONE TABLET EVERY DAY    rosuvastatin (CRESTOR) 20 mg Oral Tablet Take 20 mg by mouth Once a day     Social History     Tobacco Use   Smoking Status Never   Smokeless Tobacco Never     Social History     Substance and Sexual Activity   Alcohol Use No     Social History     Substance and Sexual Activity   Drug Use No       Review of Systems   Constitutional:  Negative for chills, fatigue and fever.   HENT:  Positive for ear pain. Negative for nosebleeds, sore throat and trouble swallowing.    Eyes:  Negative for photophobia and pain.   Respiratory:  Negative for cough and wheezing.    Cardiovascular:  Negative for chest pain and leg swelling.   Gastrointestinal:  Negative for abdominal pain, constipation and diarrhea.   Endocrine: Negative for cold intolerance and heat intolerance.   Genitourinary:  Negative for dysuria and urgency.   Musculoskeletal:  Positive for back pain and neck pain. Negative for joint swelling.   Neurological:  Negative for seizures, syncope and headaches.   Hematological:  Negative for adenopathy. Does not bruise/bleed easily.   Psychiatric/Behavioral:  Negative for confusion and hallucinations. The patient is not nervous/anxious.         Examination  Vitals: BP 120/86   Pulse 66   Temp 36.3 C (97.4 F)   Resp 17   Ht 1.803 m ('5\' 11"'$ )   Wt 107 kg (236 lb)   SpO2 97%   BMI 32.92 kg/m         Physical Exam  Constitutional: He is well-developed.  Head: Normocephalic and atraumatic.  Eyes PERRL w/o  scleral icterus.  Cardiovascular: Normal rate and regular rhythm.  Pulmonary: No wheezing or rales.  Abdominal:  General: Bowel sounds are normal.  Palpations: Abdomen is soft.  Tenderness: There is no abdominal tenderness.  Musculoskeletal: Neck supple.  Lymphadenopathy: No cervical  adenopathy.  Skin: warm w/o rash.  Neurological: alert and oriented to person, place, and time.  Cranial Nerves: No cranial nerve deficit.  Psychiatric: Behavior normal.    Assessment and Plan  62 year old male with left scalp and ear pain  Differential includes occipital neuralgia, shingles, refractory otitis media, mastoiditis.    Ear pain exacerbated by palpation of scalp suggests neuropathic etiology    Most likely diagnosis is occipital neuralgia given pain is posterior to mastoid   Would expect anterior in trigeminal  Given negative history of skin lesions, shingles is less likely  Benign cranial nerve exam and lack of physical exam findings, mastoiditis is exceedingly rare    Plan  Steroid pack.  If continues, consider imaging and/or further medication (gabapentin, SNRIs, TCAs)    Linda Hedges, MS3  Family Medicine Clerkship  [place]    1. Occipital neuralgia of left side  M54.81          Patient seen and examined the medical student acting as my scribe.  He has symptoms of nerve compression in the occipital nerve or other superficial branches.  He does travel frequently and visits his mother and often can end up sleeping in a recliner or chair.  We talked about the ergonomics and some exercises range of motion to try to reduce compressive symptoms.  Will try to reduce inflammation short term with steroids in if needs consider other medications for neuropathic pain in the future.  Consider updating imaging of the neck should there be progression of his symptoms.

## 2022-07-24 ENCOUNTER — Ambulatory Visit: Payer: Self-pay | Admitting: Cardiology

## 2022-07-24 ENCOUNTER — Ambulatory Visit: Payer: Self-pay | Admitting: Physician Assistant

## 2022-07-24 NOTE — Progress Notes (Deleted)
     HPI: FU paroxysmal atrial fibrillation. Note he had a prolonged vagal episode following an exercise treadmill requiring transient CPR in the past. Echocardiogram September 2021 showed ejection fraction 55 to 60%, mild left ventricular hypertrophy, grade 1 diastolic dysfunction, mild aortic insufficiency and mildly dilated ascending aorta at 40 mm.  Cardiac CTA October 2022 showed calcium score 0, moderate ascending aorta dilatation at 45 mm; aortic atherosclerosis.  CTA August 2023 showed 4.2 cm thoracic aortic aneurysm.  Since I last saw him,  Current Outpatient Medications  Medication Sig Dispense Refill   aspirin 81 MG tablet Take 81 mg by mouth daily.     atorvastatin (LIPITOR) 20 MG tablet Take 1 tablet (20 mg total) by mouth daily. 90 tablet 3   lansoprazole (PREVACID) 15 MG capsule Take 15 mg by mouth daily at 12 noon.     metoprolol succinate (TOPROL-XL) 50 MG 24 hr tablet Take 1 tablet (50 mg total) by mouth daily. 90 tablet 3   rosuvastatin (CRESTOR) 20 MG tablet Take 1 tablet (20 mg total) by mouth daily. 30 tablet 2   No current facility-administered medications for this visit.     Past Medical History:  Diagnosis Date   GERD    HYPERLIPIDEMIA    OSA (obstructive sleep apnea) 01/02/2018   PAROXYSMAL ATRIAL FIBRILLATION    Syncope and collapse     Past Surgical History:  Procedure Laterality Date   TONSILLECTOMY      Social History   Socioeconomic History   Marital status: Single    Spouse name: Not on file   Number of children: Not on file   Years of education: Not on file   Highest education level: Not on file  Occupational History   Not on file  Tobacco Use   Smoking status: Never   Smokeless tobacco: Never  Vaping Use   Vaping Use: Never used  Substance and Sexual Activity   Alcohol use: No   Drug use: No   Sexual activity: Not on file  Other Topics Concern   Not on file  Social History Narrative   Not on file   Social Determinants of Health    Financial Resource Strain: Not on file  Food Insecurity: Not on file  Transportation Needs: Not on file  Physical Activity: Not on file  Stress: Not on file  Social Connections: Not on file  Intimate Partner Violence: Not on file    Family History  Problem Relation Age of Onset   Heart attack Father    Coronary artery disease Other     ROS: no fevers or chills, productive cough, hemoptysis, dysphasia, odynophagia, melena, hematochezia, dysuria, hematuria, rash, seizure activity, orthopnea, PND, pedal edema, claudication. Remaining systems are negative.  Physical Exam: Well-developed well-nourished in no acute distress.  Skin is warm and dry.  HEENT is normal.  Neck is supple.  Chest is clear to auscultation with normal expansion.  Cardiovascular exam is regular rate and rhythm.  Abdominal exam nontender or distended. No masses palpated. Extremities show no edema. neuro grossly intact  ECG- personally reviewed  A/P  1 paroxysmal atrial fibrillation-patient remains in sinus rhythm.  CHA2DS2-VASc is 0 and we will therefore not anticoagulate.  Continue Toprol for rate control if atrial fibrillation recurs.  2 thoracic aortic aneurysm-plan follow-up CTA August 2024.  3 aortic insufficiency-mild on most recent echocardiogram.  4 hyperlipidemia-continue Lipitor.  Check lipids and liver.  Kirk Ruths, MD

## 2022-10-30 ENCOUNTER — Other Ambulatory Visit: Payer: Self-pay | Admitting: Cardiology

## 2022-10-30 ENCOUNTER — Telehealth: Payer: Self-pay

## 2022-10-30 NOTE — Telephone Encounter (Signed)
Called pt to verify if he is taking Crestor or Lipitor. Pt stated he is taking Crestor 20mg , and Lipitor was discontinued per Dr. Jens Som. Pt requested Lipitor be removed from his med list. Pt was advise that Lipitor would be removed and a 30 day supply of Crestor 20mg  would be sent to his pharmacy. Pt was also advise to keep upcoming appt for future refills. Pt questions and concerns were address.

## 2022-10-30 NOTE — Telephone Encounter (Signed)
Pt pharmacy is requesting a refill. Pt has Cestor and Lipitor listed on med list. Please address. Thank you

## 2022-10-30 NOTE — Telephone Encounter (Signed)
Have you called the patient to find out what he is taking? Whatever he is taking we can refill.

## 2022-11-20 ENCOUNTER — Ambulatory Visit (INDEPENDENT_AMBULATORY_CARE_PROVIDER_SITE_OTHER): Payer: Commercial Managed Care - HMO

## 2022-11-20 ENCOUNTER — Ambulatory Visit
Admission: EM | Admit: 2022-11-20 | Discharge: 2022-11-20 | Disposition: A | Discharge status: Home / Self Care | Payer: Commercial Managed Care - HMO | Attending: Internal Medicine | Admitting: Internal Medicine

## 2022-11-20 ENCOUNTER — Ambulatory Visit: Payer: Commercial Managed Care - HMO

## 2022-11-20 DIAGNOSIS — Z23 Encounter for immunization: Secondary | ICD-10-CM

## 2022-11-20 DIAGNOSIS — S61011A Laceration without foreign body of right thumb without damage to nail, initial encounter: Secondary | ICD-10-CM | POA: Diagnosis not present

## 2022-11-20 DIAGNOSIS — S62521A Displaced fracture of distal phalanx of right thumb, initial encounter for closed fracture: Secondary | ICD-10-CM | POA: Diagnosis not present

## 2022-11-20 MED ORDER — CEPHALEXIN 500 MG PO CAPS: 500.0000 mg | ORAL_CAPSULE | Freq: Four times a day (QID) | ORAL | 0 refills | Status: AC

## 2022-11-20 MED ORDER — TETANUS-DIPHTH-ACELL PERTUSSIS 5-2.5-18.5 LF-MCG/0.5 IM SUSY
0.5000 mL | PREFILLED_SYRINGE | Freq: Once | INTRAMUSCULAR | Status: AC
  Administered 2022-11-20: 0.5 mL via INTRAMUSCULAR

## 2022-11-20 NOTE — Discharge Instructions (Signed)
Suture removal in 8 days.  Follow up with Orthopaedist for recheck in 1 week

## 2022-11-20 NOTE — ED Triage Notes (Signed)
Pt states he cut his right thumb on  a grinder blade.  Bleeding controlled at this time.

## 2022-11-20 NOTE — ED Provider Notes (Signed)
[facility]    CSN: [id number] Arrival date & time: 11/20/22  1148      History   Chief Complaint Chief Complaint  Patient presents with   Laceration    HPI Colton Moore is a 62 y.o. male.   Patient complains of a laceration to his right thumb.  Patient reports that he cut his thumb at work over a rondure blade.  Patient reports area opened up.  Patient denies any other area of injuries.  The history is provided by the patient. No language interpreter was used.  Laceration Location:  Hand Length:  2 Depth:  Through underlying tissue Quality: straight   Laceration mechanism:  Metal edge Pain details:    Quality:  Aching   Severity:  Moderate   Timing:  Constant Foreign body present:  No foreign bodies Worsened by:  Nothing Tetanus status:  Up to date   Past Medical History:  Diagnosis Date   GERD    HYPERLIPIDEMIA    OSA (obstructive sleep apnea) 01/02/2018   PAROXYSMAL ATRIAL FIBRILLATION    Syncope and collapse     Patient Active Problem List   Diagnosis Date Noted   OSA (obstructive sleep apnea) 01/02/2018   HYPERLIPIDEMIA 12/22/2008   PAROXYSMAL ATRIAL FIBRILLATION 12/22/2008   GERD 12/22/2008   HYPERGLYCEMIA 12/22/2008   SYNCOPE AND COLLAPSE 12/21/2008    Past Surgical History:  Procedure Laterality Date   TONSILLECTOMY         Home Medications    Prior to Admission medications   Medication Sig Start Date End Date Taking? Authorizing Provider  cephALEXin (KEFLEX) 500 MG capsule Take 1 capsule (500 mg total) by mouth 4 (four) times daily for 10 days. 11/20/22 11/30/22 Yes Elson Areas, PA-C  aspirin 81 MG tablet Take 81 mg by mouth daily.    [provider]  lansoprazole (PREVACID) 15 MG capsule Take 15 mg by mouth daily at 12 noon.    [provider]  metoprolol succinate (TOPROL-XL) 50 MG 24 hr tablet Take 1 tablet (50 mg total) by mouth daily. 03/28/21   Cannon Kettle, PA-C  rosuvastatin (CRESTOR) 20 MG  tablet TAKE 1 TABLET (20 MG TOTAL) BY MOUTH DAILY. 10/30/22 01/28/23  Lewayne Bunting, MD    Family History Family History  Problem Relation Age of Onset   Heart attack Father    Coronary artery disease Other     Social History Social History   Tobacco Use   Smoking status: Never   Smokeless tobacco: Never  Vaping Use   Vaping Use: Never used  Substance Use Topics   Alcohol use: No   Drug use: No     Allergies   Iodine, Clindamycin, and Penicillins   Review of Systems Review of Systems  Skin:  Positive for wound.  All other systems reviewed and are negative.    Physical Exam Triage Vital Signs ED Triage Vitals [11/20/22 1252]  Enc Vitals Group     BP 128/80     Pulse Rate (!) 58     Resp 16     Temp 98.1 F (36.7 C)     Temp Source Oral     SpO2 95 %     Weight      Height      Head Circumference      Peak Flow      Pain Score 3     Pain Loc      Pain Edu?  Excl. in GC?    No data found.  Updated Vital Signs BP 128/80 (BP Location: Left Arm)   Pulse (!) 58   Temp 98.1 F (36.7 C) (Oral)   Resp 16   SpO2 95%   Visual Acuity Right Eye Distance:   Left Eye Distance:   Bilateral Distance:    Right Eye Near:   Left Eye Near:    Bilateral Near:     Physical Exam Vitals and nursing note reviewed.  Constitutional:      Appearance: He is well-developed.  HENT:     Head: Normocephalic.  Pulmonary:     Effort: Pulmonary effort is normal.  Abdominal:     General: There is no distension.  Musculoskeletal:     Cervical back: Normal range of motion.     Comments: 2 cm laceration right thumb gaping full range of motion neurovascular neurosensory intact  Neurological:     Mental Status: He is alert and oriented to person, place, and time.      UC Treatments / Results  Labs (all labs ordered are listed, but only abnormal results are displayed) Labs Reviewed - No data to display  EKG   Radiology DG Finger Thumb Right  Result  Date: 11/20/2022 CLINICAL DATA:  Injury to RIGHT thumb. EXAM: RIGHT THUMB 2+V COMPARISON:  None Available. FINDINGS: Soft tissue laceration overlying the tuft of the distal phalanx of the RIGHT thumb. Multiple calcific fragments approximating the laceration, compatible with avulsion fracture fragments from the ventral cortex of the distal phalanx. No complete fracture of the distal phalanx. Alignment of the D IP joint is normal. IMPRESSION: 1. Soft tissue laceration overlying the tuft of the distal phalanx of the RIGHT thumb. 2. Multiple calcific fragments approximating the laceration, compatible with avulsion fracture fragments from the underlying ventral cortex of the distal phalanx. Electronically Signed   By: Bary Richard M.D.   On: 11/20/2022 13:52    Procedures Laceration Repair  Date/Time: 11/20/2022 2:17 PM  Performed by: Elson Areas, PA-C Authorized by: Merrilee Jansky, MD   Consent:    Consent obtained:  Verbal   Consent given by:  Patient   Risks, benefits, and alternatives were discussed: yes     Risks discussed:  Infection   Alternatives discussed:  No treatment Universal protocol:    Procedure explained and questions answered to patient or proxy's satisfaction: yes     Relevant documents present and verified: yes     Imaging studies available: yes     Immediately prior to procedure, a time out was called: yes     Patient identity confirmed:  Verbally with patient Anesthesia:    Anesthesia method:  Nerve block   Block needle gauge:  27 G   Block anesthetic:  Lidocaine 1% w/o epi Exploration:    Wound exploration: wound explored through full range of motion   Treatment:    Area cleansed with:  Povidone-iodine   Amount of cleaning:  Standard   Visualized foreign bodies/material removed: no     Scar revision: no   Skin repair:    Repair method:  Sutures   Suture size:  5-0   Suture material:  Prolene   Suture technique:  Simple interrupted   Number of sutures:   5 Approximation:    Approximation:  Loose Repair type:    Repair type:  Simple  (including critical care time)  Medications Ordered in UC Medications  Tdap (BOOSTRIX) injection 0.5 mL (has no administration in  time range)    Initial Impression / Assessment and Plan / UC Course  I have reviewed the triage vital signs and the nursing notes.  Pertinent labs & imaging results that were available during my care of the patient were reviewed by me and considered in my medical decision making (see chart for details).     Patient counseled on possible avulsion fracture.  Patient is given a prescription for Keflex he is placed in a finger splint patient's tetanus is updated patient is advised to follow-up with orthopedic hand surgeon in 1 week.  Patient states that he will probably be at his mother's house in [place].  She has a primary care physician that he can follow-up with. Final Clinical Impressions(s) / UC Diagnoses   Final diagnoses:  Laceration of right thumb without damage to nail, foreign body presence unspecified, initial encounter     Discharge Instructions      Suture removal in 8 days.  Follow up with Orthopaedist for recheck in 1 week    ED Prescriptions     Medication Sig Dispense Auth. Provider   cephALEXin (KEFLEX) 500 MG capsule Take 1 capsule (500 mg total) by mouth 4 (four) times daily for 10 days. 28 capsule Elson Areas, [place]      PDMP not reviewed this encounter. An After Visit Summary was printed and given to the patient.    Elson Areas, [place] 11/20/22 1421

## 2022-12-02 NOTE — Progress Notes (Deleted)
     HPI: FU paroxysmal atrial fibrillation. Note he had a prolonged vagal episode following an exercise treadmill requiring transient CPR in the past. Echocardiogram September 2021 showed ejection fraction 55 to 60%, mild left ventricular hypertrophy, grade 1 diastolic dysfunction, mild aortic insufficiency and mildly dilated ascending aorta at 40 mm.  Cardiac CTA October 2022 showed calcium score 0, moderate ascending aorta dilatation at 45 mm; aortic atherosclerosis.  CTA August 2023 showed 4.2 cm ascending thoracic aortic aneurysm.  Since I last saw him,   Current Outpatient Medications  Medication Sig Dispense Refill   aspirin 81 MG tablet Take 81 mg by mouth daily.     lansoprazole (PREVACID) 15 MG capsule Take 15 mg by mouth daily at 12 noon.     metoprolol succinate (TOPROL-XL) 50 MG 24 hr tablet Take 1 tablet (50 mg total) by mouth daily. 90 tablet 3   rosuvastatin (CRESTOR) 20 MG tablet TAKE 1 TABLET (20 MG TOTAL) BY MOUTH DAILY. 30 tablet 0   No current facility-administered medications for this visit.     Past Medical History:  Diagnosis Date   GERD    HYPERLIPIDEMIA    OSA (obstructive sleep apnea) 01/02/2018   PAROXYSMAL ATRIAL FIBRILLATION    Syncope and collapse     Past Surgical History:  Procedure Laterality Date   TONSILLECTOMY      Social History   Socioeconomic History   Marital status: Single    Spouse name: Not on file   Number of children: Not on file   Years of education: Not on file   Highest education level: Not on file  Occupational History   Not on file  Tobacco Use   Smoking status: Never   Smokeless tobacco: Never  Vaping Use   Vaping Use: Never used  Substance and Sexual Activity   Alcohol use: No   Drug use: No   Sexual activity: Not on file  Other Topics Concern   Not on file  Social History Narrative   Not on file   Social Determinants of Health   Financial Resource Strain: Not on file  Food Insecurity: Not on file   Transportation Needs: Not on file  Physical Activity: Not on file  Stress: Not on file  Social Connections: Not on file  Intimate Partner Violence: Not on file    Family History  Problem Relation Age of Onset   Heart attack Father    Coronary artery disease Other     ROS: no fevers or chills, productive cough, hemoptysis, dysphasia, odynophagia, melena, hematochezia, dysuria, hematuria, rash, seizure activity, orthopnea, PND, pedal edema, claudication. Remaining systems are negative.  Physical Exam: Well-developed well-nourished in no acute distress.  Skin is warm and dry.  HEENT is normal.  Neck is supple.  Chest is clear to auscultation with normal expansion.  Cardiovascular exam is regular rate and rhythm.  Abdominal exam nontender or distended. No masses palpated. Extremities show no edema. neuro grossly intact  ECG- personally reviewed  A/P  1 history of paroxysmal atrial fibrillation-continue Toprol for rate control if atrial fibrillation recurs.  CHA2DS2-VASc is 0 and he is therefore not anticoagulated.  2 thoracic aortic aneurysm-plan follow-up CTA August 2024.  3 hyperlipidemia-continue Lipitor 20 mg daily.  Check lipids and liver.  4 aortic insufficiency-mild on most recent echocardiogram.  Olga Millers, MD

## 2022-12-06 ENCOUNTER — Encounter (INDEPENDENT_AMBULATORY_CARE_PROVIDER_SITE_OTHER): Payer: Self-pay | Admitting: Family Medicine

## 2022-12-06 ENCOUNTER — Ambulatory Visit (INDEPENDENT_AMBULATORY_CARE_PROVIDER_SITE_OTHER): Payer: No Typology Code available for payment source | Admitting: Family Medicine

## 2022-12-06 VITALS — BP 118/64 | HR 63 | Temp 97.9°F | Resp 18 | Ht 71.0 in | Wt 232.8 lb

## 2022-12-06 DIAGNOSIS — I77810 Thoracic aortic ectasia: Secondary | ICD-10-CM

## 2022-12-06 DIAGNOSIS — I480 Paroxysmal atrial fibrillation: Secondary | ICD-10-CM

## 2022-12-06 DIAGNOSIS — E7800 Pure hypercholesterolemia, unspecified: Secondary | ICD-10-CM

## 2022-12-06 DIAGNOSIS — Z0000 Encounter for general adult medical examination without abnormal findings: Secondary | ICD-10-CM

## 2022-12-06 DIAGNOSIS — M5136 Other intervertebral disc degeneration, lumbar region: Secondary | ICD-10-CM

## 2022-12-06 NOTE — Progress Notes (Signed)
[facility]  [address] [facility] [address]        Encounter Date: 12/06/2022  8:15 AM EDT      Name: Shaun Lopez Shaun Lopez  Age: 62 y.o.  DOB: Aug 19, 1960  Sex: male    Chief Complaint:   Chief Complaint   Patient presents with    Annual Exam       HPI  Patient here for his annual exam.  He reports his health overall is good.  His fatigue only usually is when he is in [place] as he still travels back and forth from out of state to spend some time with his mom who has some extra needs.  He admits that at when he is in [place] sleeps little disrupted and he is a little more limited in the number of hours he is getting.  At home he is usually sleeping a good 8 hr.  He stays busy with outdoor activities.  He knows his nutrition could be a little bit better as he has a fan of sweet tea and burgers.  Overall he has been doing well with his numbers he does follow with a cardiologist in [place].  He follows with occasional CTs for a mildly elevated aortic root actually decreased in size from his CT scans over the last few years.  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  Bowels and bladder operate normally.  Believes he is due for scope and the next year to.  History   Family Medical History:       Problem Relation (Age of Onset)    Arthritis-rheumatoid Mother    Cancer Sister    Colon Cancer Maternal Grandfather    Heart Attack Father    Hypertension (High Blood Pressure) Mother    Other Mother          Past Medical History:   Diagnosis Date    Atrial fibrillation (CMS HCC)     Cancer (CMS HCC)     skin 2007    Esophageal reflux     High cholesterol     Melanoma (CMS HCC)     2007    Varicella        Past Surgical History:   Procedure Laterality Date    COLONOSCOPY      HX OTHER      Melanoma removal    HX TONSILLECTOMY      HX WISDOM TEETH EXTRACTION         Patient Active Problem List    Diagnosis    DDD (degenerative disc disease), lumbar    Iron deficiency  anemia    OSA (obstructive sleep apnea)    PAF (paroxysmal atrial fibrillation) (CMS HCC)    GERD (gastroesophageal reflux disease)    Pure hypercholesterolemia     Current Outpatient Medications   Medication Sig    aspirin (ECOTRIN) 81 mg Oral Tablet, Delayed Release (E.C.) Take 81 mg by mouth Once a day    LANSOPRAZOLE (PREVACID ORAL) Take 40 mg by mouth Once a day    metoprolol succinate (TOPROL-XL) 50 mg Oral Tablet Sustained Release 24 hr TAKE ONE TABLET EVERY DAY    rosuvastatin (CRESTOR) 20 mg Oral Tablet Take 20 mg by mouth Once a day     Social History     Tobacco Use   Smoking Status Never   Smokeless Tobacco Never     Social History  Substance and Sexual Activity   Alcohol Use No       Review of Systems   Constitutional:  Negative for chills, fatigue and fever.   HENT:  Negative for nosebleeds, sore throat and trouble swallowing.    Eyes:  Negative for photophobia and pain.   Respiratory:  Negative for cough and wheezing.    Cardiovascular:  Negative for chest pain and leg swelling.   Gastrointestinal:  Negative for abdominal pain, constipation and diarrhea.   Endocrine: Negative for cold intolerance and heat intolerance.   Genitourinary:  Negative for dysuria and urgency.   Musculoskeletal:  Positive for arthralgias. Negative for back pain and joint swelling.   Neurological:  Negative for seizures, syncope and headaches.   Hematological:  Negative for adenopathy. Does not bruise/bleed easily.   Psychiatric/Behavioral:  Negative for confusion and hallucinations. The patient is not nervous/anxious.         Examination  Vitals: BP 118/64   Pulse 63   Temp 36.6 C (97.9 F)   Resp 18   Ht 1.803 m (5\' 11" )   Wt 106 kg (232 lb 12.8 oz)   SpO2 96%   BMI 32.47 kg/m         Physical Exam  Constitutional:       Appearance: He is not diaphoretic.   HENT:      Head: Normocephalic and atraumatic.   Eyes:      General: No scleral icterus.     Pupils: Pupils are equal, round, and reactive to light.   Neck:       Thyroid: No thyromegaly.   Cardiovascular:      Rate and Rhythm: Normal rate and regular rhythm.      Heart sounds: No murmur heard.  Pulmonary:      Effort: Pulmonary effort is normal.      Breath sounds: Normal breath sounds. No wheezing.   Abdominal:      General: Bowel sounds are normal.      Palpations: Abdomen is soft.      Tenderness: There is no abdominal tenderness.   Musculoskeletal:      Cervical back: Normal range of motion.      Right lower leg: No edema.      Left lower leg: No edema.   Lymphadenopathy:      Cervical: No cervical adenopathy.   Skin:     General: Skin is warm and dry.      Findings: No rash.   Neurological:      Mental Status: He is alert and oriented to person, place, and time.   Psychiatric:         Judgment: Judgment normal.         Assessment and Plan  [name] was seen today for annual exam.    Well adult health check    PAF (paroxysmal atrial fibrillation) (CMS HCC)    Pure hypercholesterolemia    DDD (degenerative disc disease), lumbar    Dilated aortic root (CMS HCC)    Reviewed his outside record.  Tell him to dedicate a little more scheduled sleep time well he is in town.  Try to work on quality of his sleep and nutrition as well overall pattern.  He is going to let us know when he has some blood work in the other system to see if we need to add on any labs his previous iron deficiency and resolved based on his last few labs.    Return in about 1  year (around 12/06/2023).    Corinna Capra, MD

## 2022-12-09 ENCOUNTER — Encounter (INDEPENDENT_AMBULATORY_CARE_PROVIDER_SITE_OTHER): Payer: Self-pay | Admitting: Family Medicine

## 2022-12-10 ENCOUNTER — Other Ambulatory Visit: Payer: Self-pay | Admitting: *Deleted

## 2022-12-10 MED ORDER — ROSUVASTATIN CALCIUM 20 MG PO TABS: 20.0000 mg | ORAL_TABLET | Freq: Every day | ORAL | 3 refills | Status: DC

## 2022-12-11 ENCOUNTER — Ambulatory Visit: Payer: Commercial Managed Care - HMO | Admitting: Cardiology

## 2022-12-30 ENCOUNTER — Other Ambulatory Visit: Payer: Self-pay | Admitting: *Deleted

## 2022-12-30 DIAGNOSIS — I7121 Aneurysm of the ascending aorta, without rupture: Secondary | ICD-10-CM

## 2023-01-27 ENCOUNTER — Ambulatory Visit: Payer: Commercial Managed Care - HMO | Admitting: Cardiology

## 2023-01-29 ENCOUNTER — Encounter: Payer: Self-pay | Admitting: Cardiology

## 2023-02-13 ENCOUNTER — Ambulatory Visit (HOSPITAL_COMMUNITY): Payer: Commercial Managed Care - HMO

## 2023-03-04 NOTE — Progress Notes (Signed)
HPI: FU paroxysmal atrial fibrillation. Note he had a prolonged vagal episode following an exercise treadmill requiring transient CPR in the past. Echocardiogram September 2021 showed ejection fraction 55 to 60%, mild left ventricular hypertrophy, grade 1 diastolic dysfunction, mild aortic insufficiency and mildly dilated ascending aorta at 40 mm.  Cardiac CTA October 2022 showed calcium score 0, no significant coronary disease, moderate ascending aorta dilatation at 45 mm; aortic atherosclerosis.  CTA of the thoracic aorta September 2024 showed 4.5 cm ascending thoracic aortic aneurysm, 6 mm right upper lobe pulmonary nodule and follow-up recommended 6 to 12 months. Since I last saw him, the patient denies any dyspnea on exertion, orthopnea, PND, pedal edema, palpitations, syncope or chest pain.   Current Outpatient Medications  Medication Sig Dispense Refill   aspirin 81 MG tablet Take 81 mg by mouth daily.     lansoprazole (PREVACID) 15 MG capsule Take 15 mg by mouth daily at 12 noon.     metoprolol succinate (TOPROL-XL) 50 MG 24 hr tablet Take 1 tablet (50 mg total) by mouth daily. 90 tablet 3   rosuvastatin (CRESTOR) 20 MG tablet Take 1 tablet (20 mg total) by mouth daily. 90 tablet 3   No current facility-administered medications for this visit.     Past Medical History:  Diagnosis Date   GERD    HYPERLIPIDEMIA    OSA (obstructive sleep apnea) 01/02/2018   PAROXYSMAL ATRIAL FIBRILLATION    Syncope and collapse     Past Surgical History:  Procedure Laterality Date   TONSILLECTOMY      Social History   Socioeconomic History   Marital status: Single    Spouse name: Not on file   Number of children: Not on file   Years of education: Not on file   Highest education level: Not on file  Occupational History   Not on file  Tobacco Use   Smoking status: Never   Smokeless tobacco: Never  Vaping Use   Vaping status: Never Used  Substance and Sexual Activity   Alcohol  use: No   Drug use: No   Sexual activity: Not on file  Other Topics Concern   Not on file  Social History Narrative   Not on file   Social Determinants of Health   Financial Resource Strain: Not on file  Food Insecurity: Not on file  Transportation Needs: Not on file  Physical Activity: Not on file  Stress: Not on file  Social Connections: Not on file  Intimate Partner Violence: Not on file    Family History  Problem Relation Age of Onset   Heart attack Father    Coronary artery disease Other     ROS: no fevers or chills, productive cough, hemoptysis, dysphasia, odynophagia, melena, hematochezia, dysuria, hematuria, rash, seizure activity, orthopnea, PND, pedal edema, claudication. Remaining systems are negative.  Physical Exam: Well-developed well-nourished in no acute distress.  Skin is warm and dry.  HEENT is normal.  Neck is supple.  Chest is clear to auscultation with normal expansion.  Cardiovascular exam is regular rate and rhythm.  Abdominal exam nontender or distended. No masses palpated. Extremities show no edema. neuro grossly intact  EKG Interpretation Date/Time:  Monday March 17 2023 11:23:35 EDT Ventricular Rate:  63 PR Interval:  166 QRS Duration:  112 QT Interval:  406 QTC Calculation: 415 R Axis:   -39  Text Interpretation: Normal sinus rhythm Left axis deviation Moderate voltage criteria for LVH, may be normal variant ( R  in aVL , Cornell product ) Confirmed by Olga Millers ([id number]) on 03/17/2023 11:32:34 AM     A/P  1 paroxysmal atrial fibrillation-patient remains in sinus.  Will continue Toprol at present dose.  He is not on anticoagulation as his CHA2DS2-VASc is 0.  2 thoracic aortic aneurysm-plan follow-up CTA September 2025.  3 hyperlipidemia-plan to continue Lipitor at present dose.  He had elevated liver functions on higher doses in the past.  Check lipids and liver.  Will also check hemoglobin and renal function.  4 aortic  insufficiency-mild on most recent echocardiogram.  5 lung nodule-plan follow-up CT as outlined above September 2025.  Olga Millers, MD

## 2023-03-05 ENCOUNTER — Ambulatory Visit: Payer: Commercial Managed Care - HMO | Admitting: Cardiology

## 2023-03-12 ENCOUNTER — Ambulatory Visit (HOSPITAL_COMMUNITY)
Admission: RE | Admit: 2023-03-12 | Discharge: 2023-03-12 | Disposition: A | Discharge status: Home / Self Care | Payer: Commercial Managed Care - HMO | Source: Ambulatory Visit | Attending: Cardiology | Admitting: Cardiology

## 2023-03-12 DIAGNOSIS — I7121 Aneurysm of the ascending aorta, without rupture: Secondary | ICD-10-CM | POA: Insufficient documentation

## 2023-03-12 MED ORDER — IOHEXOL 350 MG/ML SOLN
100.0000 mL | Freq: Once | INTRAVENOUS | Status: AC | PRN
  Administered 2023-03-12: 100 mL via INTRAVENOUS

## 2023-03-12 MED ORDER — SODIUM CHLORIDE (PF) 0.9 % IJ SOLN
INTRAMUSCULAR | Status: AC
  Filled 2023-03-12: qty 50

## 2023-03-17 ENCOUNTER — Ambulatory Visit: Payer: Commercial Managed Care - HMO | Admitting: Cardiology

## 2023-03-17 ENCOUNTER — Encounter: Payer: Self-pay | Admitting: Cardiology

## 2023-03-17 VITALS — BP 118/62 | HR 63 | Ht 71.0 in | Wt 237.0 lb

## 2023-03-17 DIAGNOSIS — E7800 Pure hypercholesterolemia, unspecified: Secondary | ICD-10-CM | POA: Diagnosis not present

## 2023-03-17 DIAGNOSIS — I7121 Aneurysm of the ascending aorta, without rupture: Secondary | ICD-10-CM | POA: Diagnosis not present

## 2023-03-17 DIAGNOSIS — I351 Nonrheumatic aortic (valve) insufficiency: Secondary | ICD-10-CM

## 2023-03-17 DIAGNOSIS — D508 Other iron deficiency anemias: Secondary | ICD-10-CM

## 2023-03-17 DIAGNOSIS — Z09 Encounter for follow-up examination after completed treatment for conditions other than malignant neoplasm: Secondary | ICD-10-CM | POA: Diagnosis not present

## 2023-03-17 DIAGNOSIS — I480 Paroxysmal atrial fibrillation: Secondary | ICD-10-CM | POA: Diagnosis not present

## 2023-03-17 NOTE — Patient Instructions (Signed)

## 2023-04-22 ENCOUNTER — Other Ambulatory Visit (INDEPENDENT_AMBULATORY_CARE_PROVIDER_SITE_OTHER): Payer: Self-pay | Admitting: Family Medicine

## 2023-05-09 ENCOUNTER — Emergency Department (HOSPITAL_COMMUNITY): Payer: Commercial Managed Care - HMO

## 2023-05-09 ENCOUNTER — Encounter (HOSPITAL_COMMUNITY): Payer: Self-pay

## 2023-05-09 ENCOUNTER — Other Ambulatory Visit: Payer: Self-pay

## 2023-05-09 ENCOUNTER — Emergency Department (HOSPITAL_COMMUNITY)
Admission: EM | Admit: 2023-05-09 | Discharge: 2023-05-09 | Disposition: A | Discharge status: Home / Self Care | Payer: Commercial Managed Care - HMO | Attending: Emergency Medicine | Admitting: Emergency Medicine

## 2023-05-09 DIAGNOSIS — S0990XA Unspecified injury of head, initial encounter: Secondary | ICD-10-CM | POA: Insufficient documentation

## 2023-05-09 DIAGNOSIS — Z7982 Long term (current) use of aspirin: Secondary | ICD-10-CM | POA: Diagnosis not present

## 2023-05-09 DIAGNOSIS — M7989 Other specified soft tissue disorders: Secondary | ICD-10-CM | POA: Insufficient documentation

## 2023-05-09 DIAGNOSIS — W11XXXA Fall on and from ladder, initial encounter: Secondary | ICD-10-CM | POA: Diagnosis not present

## 2023-05-09 DIAGNOSIS — S52572A Other intraarticular fracture of lower end of left radius, initial encounter for closed fracture: Secondary | ICD-10-CM | POA: Diagnosis not present

## 2023-05-09 DIAGNOSIS — S6992XA Unspecified injury of left wrist, hand and finger(s), initial encounter: Secondary | ICD-10-CM | POA: Diagnosis present

## 2023-05-09 DIAGNOSIS — S62102A Fracture of unspecified carpal bone, left wrist, initial encounter for closed fracture: Secondary | ICD-10-CM

## 2023-05-09 DIAGNOSIS — J939 Pneumothorax, unspecified: Secondary | ICD-10-CM | POA: Diagnosis not present

## 2023-05-09 LAB — CBC WITH DIFFERENTIAL/PLATELET
Abs Immature Granulocytes: 0.09 10*3/uL — ABNORMAL HIGH (ref 0.00–0.07)
Basophils Absolute: 0 10*3/uL (ref 0.0–0.1)
Basophils Relative: 1 %
Eosinophils Absolute: 0.2 10*3/uL (ref 0.0–0.5)
Eosinophils Relative: 2 %
HCT: 44.8 % (ref 39.0–52.0)
Hemoglobin: 15 g/dL (ref 13.0–17.0)
Immature Granulocytes: 1 %
Lymphocytes Relative: 28 %
Lymphs Abs: 2.2 10*3/uL (ref 0.7–4.0)
MCH: 29.6 pg (ref 26.0–34.0)
MCHC: 33.5 g/dL (ref 30.0–36.0)
MCV: 88.5 fL (ref 80.0–100.0)
Monocytes Absolute: 0.6 10*3/uL (ref 0.1–1.0)
Monocytes Relative: 8 %
Neutro Abs: 4.8 10*3/uL (ref 1.7–7.7)
Neutrophils Relative %: 60 %
Platelets: 222 10*3/uL (ref 150–400)
RBC: 5.06 MIL/uL (ref 4.22–5.81)
RDW: 12.9 % (ref 11.5–15.5)
WBC: 7.9 10*3/uL (ref 4.0–10.5)
nRBC: 0 % (ref 0.0–0.2)

## 2023-05-09 LAB — BASIC METABOLIC PANEL
Anion gap: 13 (ref 5–15)
BUN: 18 mg/dL (ref 8–23)
CO2: 19 mmol/L — ABNORMAL LOW (ref 22–32)
Calcium: 9.4 mg/dL (ref 8.9–10.3)
Chloride: 108 mmol/L (ref 98–111)
Creatinine, Ser: 1.08 mg/dL (ref 0.61–1.24)
GFR, Estimated: >60 mL/min (ref >60)
Glucose, Bld: 140 mg/dL — ABNORMAL HIGH (ref 70–99)
Potassium: 3.9 mmol/L (ref 3.5–5.1)
Sodium: 140 mmol/L (ref 135–145)

## 2023-05-09 MED ORDER — HYDROCODONE-ACETAMINOPHEN 5-325 MG PO TABS: 1.0000 | ORAL_TABLET | Freq: Four times a day (QID) | ORAL | 0 refills | Status: AC | PRN

## 2023-05-09 MED ORDER — HYDROCODONE-ACETAMINOPHEN 5-325 MG PO TABS
1.0000 | ORAL_TABLET | Freq: Once | ORAL | Status: AC
  Administered 2023-05-09: 1 via ORAL
  Filled 2023-05-09: qty 1

## 2023-05-09 MED ORDER — MORPHINE SULFATE (PF) 4 MG/ML IV SOLN
4.0000 mg | Freq: Once | INTRAVENOUS | Status: AC
  Administered 2023-05-09: 4 mg via INTRAVENOUS
  Filled 2023-05-09: qty 1

## 2023-05-09 MED ORDER — ONDANSETRON HCL 4 MG/2ML IJ SOLN
4.0000 mg | Freq: Once | INTRAMUSCULAR | Status: AC
  Administered 2023-05-09: 4 mg via INTRAVENOUS
  Filled 2023-05-09: qty 2

## 2023-05-09 MED ORDER — HYDROCODONE-ACETAMINOPHEN 5-325 MG PO TABS: 1.0000 | ORAL_TABLET | Freq: Four times a day (QID) | ORAL | 0 refills | Status: DC | PRN

## 2023-05-09 NOTE — Progress Notes (Signed)
Orthopedic Tech Progress Note Patient Details:  Colton Moore Colton Moore March 17, 1961 [id number]  Ortho Devices Type of Ortho Device: Arm sling, Sugartong splint Ortho Device/Splint Location: LUE Ortho Device/Splint Interventions: Application   Post Interventions Patient Tolerated: Well  Colton Moore Colton Moore 05/09/2023, 4:07 PM

## 2023-05-09 NOTE — ED Notes (Signed)
Patient requested prescription to be changed over to the [facility] on [address] in [place], [place]. Provider aware and pharmacy has been changed. Patient aware and d/c'd at this time with family at bedside. IV discontinued by Jacob Moores.

## 2023-05-09 NOTE — Discharge Instructions (Addendum)
You were seen in the emergency room today for left wrist fracture.  Please follow-up with hand surgery for further evaluation of symptoms.  Please continue wearing splint at home.  You can alternate Tylenol and ibuprofen for pain control.  You can take Norco for breakthrough pain.  Please return to emergency room with any new or worsening symptoms including shortness of breath, increased pain.

## 2023-05-09 NOTE — ED Provider Notes (Signed)
[place] EMERGENCY DEPARTMENT AT [facility] Provider Note   CSN: [id number] Arrival date & time: 05/09/23  1248     History  Chief Complaint  Patient presents with   Colton Moore is a 62 y.o. male with past medical history of hyperlipidemia, paroxysmal A-fib, GERD, obstructive sleep apnea presenting to emergency room after falling 8 feet off a ladder while cutting down tree, reports tree fell and hit ladder. Patient reports he landed on the left side of his body and immediately had left chest wall pain, pain is worse with deep breath in, pain is worse with movement.  No loss of consciousness, patient unsure if he hit his head, patient is not on blood thinners. Patient ambulatory, able to move extremities. Significant left sided chest wall pain.  Denies any blurry vision, change in vision, focal neurological deficits, no nausea vomiting diarrhea, no shortness of breath or dizziness.    Fall       Home Medications Prior to Admission medications   Medication Sig Start Date End Date Taking? Authorizing Provider  aspirin 81 MG tablet Take 81 mg by mouth daily.    [provider]  lansoprazole (PREVACID) 15 MG capsule Take 15 mg by mouth daily at 12 noon.    [provider]  metoprolol succinate (TOPROL-XL) 50 MG 24 hr tablet Take 1 tablet (50 mg total) by mouth daily. 03/28/21   Cannon Kettle, PA-C  rosuvastatin (CRESTOR) 20 MG tablet Take 1 tablet (20 mg total) by mouth daily. 12/10/22 03/10/23  Lewayne Bunting, MD      Allergies    Iodine, Clindamycin, and Penicillins    Review of Systems   Review of Systems  Musculoskeletal:        Left chest wall pain    Physical Exam Updated Vital Signs BP (!) 146/85 (BP Location: Right Arm)   Pulse 66   Temp 98.2 F (36.8 C) (Oral)   Resp 18   Ht 5\' 11"  (1.803 m)   Wt 104.3 kg   SpO2 96%   BMI 32.08 kg/m  Physical Exam Vitals and nursing note reviewed.  Constitutional:       General: He is not in acute distress.    Appearance: He is not toxic-appearing.  HENT:     Head: Normocephalic and atraumatic.  Eyes:     General: No scleral icterus.    Conjunctiva/sclera: Conjunctivae normal.  Cardiovascular:     Rate and Rhythm: Normal rate and regular rhythm.     Pulses: Normal pulses.     Heart sounds: Normal heart sounds.  Pulmonary:     Effort: Pulmonary effort is normal. No respiratory distress.     Breath sounds: Normal breath sounds.  Chest:     Chest wall: Tenderness present.  Abdominal:     General: Abdomen is flat. Bowel sounds are normal.     Palpations: Abdomen is soft.     Tenderness: There is no abdominal tenderness.  Musculoskeletal:     Right lower leg: No edema.     Left lower leg: No edema.     Comments: Significant tenderness over left lower rib  Skin:    General: Skin is warm and dry.     Findings: No lesion.  Neurological:     General: No focal deficit present.     Mental Status: He is alert and oriented to person, place, and time. Mental status is at baseline.     ED  Results / Procedures / Treatments   Labs (all labs ordered are listed, but only abnormal results are displayed) Labs Reviewed - No data to display  EKG None  Radiology No results found.  Procedures Procedures    Medications Ordered in ED Medications - No data to display  ED Course/ Medical Decision Making/ A&P Clinical Course as of 05/09/23 2139  [facility] May 09, 2023  1608 Repeat x-ray in 4-6 hours per gen surg [JB]  1820 Patient reassessed, pain is under control [JB]    Clinical Course User Index [JB] Herbie Lehrmann, Horald Chestnut, PA-C                                 Medical Decision Making Amount and/or Complexity of Data Reviewed Labs: ordered. Radiology: ordered.  Risk Prescription drug management.   Colton Moore 62 y.o. presented today for fall. Working DDx that I considered at this time includes, but not limited to, intracranial hemorrhage,  subdural/epidural hematoma, vertebral fracture, spinal cord injury, muscle strain, skull fracture, fracture, splenic injury, liver injury, perforated viscus, contusions.  R/o DDx: These diagnoses are less consistent than current impression due to findings on history of present illness, physical exam, labs/imaging findings.  Review of prior external notes: none   Pmhx: hyperlipidemia, paroxysmal A-fib, GERD, obstructive sleep apnea  Unique Tests and My Interpretation:  CBC BMP   Imaging:  CT head: no intracranial abnormality  CT neck: no acute fracture or changes  Ct chest: Trace left apical pneumothorax with trace paraspinal component, No left-sided rib fracture, Single tiny gas bubble in the low anterior mediastinum likely related to barotrauma, nodularity posterior right upper lobe and . Peripheral subpleural 5 mm nodular opacities without need for follow up. Chest portable x-ray: unremarkable, repeat chest x-ray showing trace left apical pneumothorax  Left wrist x-ray: 1. Mildly displaced and comminuted intra-articular fracture of the radial styloid. 2. Acute triquetral fracture.    Problem List / ED Course / Critical interventions / Medication management  Patient reporting to emergency room after fall.  CT of head and neck without acute intercranial cervical fracture.  Patient had no loss of consciousness did not hit head.  No altered mental status or confusion following fall.  Patient has no focal neurological deficits on exam.  Patient able to move extremities without significant difficulty.  Does report left wrist pain x-rays show fracture, no open wound, compartments soft, neurovascularly intact.  Patient out also has CT chest with small pneumothorax.  Consulted surgery who recommended repeat imaging.  Repeat imaging shows stable findings.  Patient is well-appearing and hemodynamically stable throughout stay. Patient ambulating with pulse ox without obvious desaturation and without  increased work of breathing prior to discharge.  Report pain under control.  Patient was offered admission, he declined.  Discussed return precautions. I ordered medication including Morphine, Zofran  for pain  Reevaluation of the patient after these medicines showed that the patient improved Patients vitals assessed. Upon arrival patient is hemodynamically stable.  I have reviewed the patients home medicines and have made adjustments as needed    Consult: Consulted with surgery who recommended repeat x-ray in 4 to 6 hours to reevaluate pneumothorax and make sure it is stable, if stable patient okay to be discharged without obvious change in imaging.   Plan: Follow-up with primary care for pneumothorax for repeat x-ray Follow-up with hand surgery for radial and triquetrum fracture Given strict return precautions to  return to emergency room.          Final Clinical Impression(s) / ED Diagnoses Final diagnoses:  Closed fracture of left wrist, initial encounter  Pneumothorax, unspecified type    Rx / DC Orders ED Discharge Orders     None         Smitty Knudsen, PA-C 05/09/23 2148    Elayne Snare K, DO 05/10/23 [phone]

## 2023-05-09 NOTE — ED Triage Notes (Signed)
Patient stated he fell off a 8 foot ladder after losing balance. Stated it hurts to breathe. Complaining of whole left side pain. Denies blood thinners. Stated he doesn't know if he hit his head. C collar place on arrival. No neck pain now. Left wrist and left rib pain.

## 2023-05-09 NOTE — ED Notes (Signed)
Pulse ox ambulating 94% provider aware

## 2023-05-18 ENCOUNTER — Emergency Department (HOSPITAL_BASED_OUTPATIENT_CLINIC_OR_DEPARTMENT_OTHER)
Admission: EM | Admit: 2023-05-18 | Discharge: 2023-05-18 | Disposition: A | Discharge status: Home / Self Care | Payer: Commercial Managed Care - HMO | Attending: Emergency Medicine | Admitting: Emergency Medicine

## 2023-05-18 ENCOUNTER — Emergency Department (HOSPITAL_BASED_OUTPATIENT_CLINIC_OR_DEPARTMENT_OTHER): Payer: Commercial Managed Care - HMO | Admitting: Radiology

## 2023-05-18 ENCOUNTER — Emergency Department (HOSPITAL_BASED_OUTPATIENT_CLINIC_OR_DEPARTMENT_OTHER): Payer: Commercial Managed Care - HMO

## 2023-05-18 DIAGNOSIS — R1012 Left upper quadrant pain: Secondary | ICD-10-CM | POA: Insufficient documentation

## 2023-05-18 DIAGNOSIS — I10 Essential (primary) hypertension: Secondary | ICD-10-CM | POA: Diagnosis not present

## 2023-05-18 DIAGNOSIS — R0789 Other chest pain: Secondary | ICD-10-CM | POA: Insufficient documentation

## 2023-05-18 DIAGNOSIS — M7989 Other specified soft tissue disorders: Secondary | ICD-10-CM | POA: Insufficient documentation

## 2023-05-18 DIAGNOSIS — Z7982 Long term (current) use of aspirin: Secondary | ICD-10-CM | POA: Insufficient documentation

## 2023-05-18 DIAGNOSIS — R600 Localized edema: Secondary | ICD-10-CM | POA: Diagnosis not present

## 2023-05-18 DIAGNOSIS — M25532 Pain in left wrist: Secondary | ICD-10-CM | POA: Insufficient documentation

## 2023-05-18 DIAGNOSIS — Z79899 Other long term (current) drug therapy: Secondary | ICD-10-CM | POA: Diagnosis not present

## 2023-05-18 LAB — BASIC METABOLIC PANEL
Anion gap: 7 (ref 5–15)
BUN: 19 mg/dL (ref 8–23)
CO2: 25 mmol/L (ref 22–32)
Calcium: 8.9 mg/dL (ref 8.9–10.3)
Chloride: 107 mmol/L (ref 98–111)
Creatinine, Ser: 0.99 mg/dL (ref 0.61–1.24)
GFR, Estimated: >60 mL/min (ref >60)
Glucose, Bld: 91 mg/dL (ref 70–99)
Potassium: 4.2 mmol/L (ref 3.5–5.1)
Sodium: 139 mmol/L (ref 135–145)

## 2023-05-18 LAB — CBC
HCT: 39.9 % (ref 39.0–52.0)
Hemoglobin: 13.9 g/dL (ref 13.0–17.0)
MCH: 30.2 pg (ref 26.0–34.0)
MCHC: 34.8 g/dL (ref 30.0–36.0)
MCV: 86.7 fL (ref 80.0–100.0)
Platelets: 246 10*3/uL (ref 150–400)
RBC: 4.6 MIL/uL (ref 4.22–5.81)
RDW: 12.9 % (ref 11.5–15.5)
WBC: 6.6 10*3/uL (ref 4.0–10.5)
nRBC: 0 % (ref 0.0–0.2)

## 2023-05-18 LAB — TROPONIN I (HIGH SENSITIVITY): Troponin I (High Sensitivity): 3 ng/L (ref <18)

## 2023-05-18 NOTE — ED Provider Notes (Signed)
[place] EMERGENCY DEPARTMENT AT [facility] Provider Note   CSN: [id number] Arrival date & time: 05/18/23  1050     History  Chief Complaint  Patient presents with   Chest Pain    Colton Moore is a 62 y.o. male.  Patient is a 62 year old male with a history of hypertension and GERD also with a history of falling 8 to 10 feet from a ladder 11 days ago having imaging in the emergency room that showed a small pneumothorax but no obvious rib fractures, wrist fracture who was discharged home after observation due to no change in imaging who is returning today with several issues.  Patient reports he had been taking ibuprofen and hydrocodone and was having fairly significant pain which he suspected was normal.  However he states that on Thursday the pain medication finished and he has continued to have pain.  It is all on the left side worse with bending and twisting but also painful with a deep breath that sometimes makes him catch his breath.  Also in the last 2 to 3 days he has noted swelling in bilateral lower extremities which he is not sure where that has come from but it does seem to improve with elevation and seems a bit better this morning.  He does not have lower abdominal pain but does have pain in that left upper abdomen as well.  He denies any further falls.  He is not on any anticoagulation.  Denies any urinary or bowel changes.  No fever or cough.  He reports being told if he had ongoing pain or new symptoms to return for evaluation.  The history is provided by the patient.  Chest Pain      Home Medications Prior to Admission medications   Medication Sig Start Date End Date Taking? Authorizing Provider  aspirin 81 MG tablet Take 81 mg by mouth daily.    [provider]  Cholecalciferol (VITAMIN D3 PO) Take 1 capsule by mouth daily with breakfast.    [provider]  Coenzyme Q10 (CO Q10 PO) Take 1 capsule by mouth daily with breakfast.     [provider]  HYDROcodone-acetaminophen (NORCO/VICODIN) 5-325 MG tablet Take 1 tablet by mouth every 6 (six) hours as needed for severe pain (pain score 7-10). 05/09/23   Barrett, Horald Chestnut, PA-C  lansoprazole (PREVACID) 15 MG capsule Take 15-30 mg by mouth daily as needed (for acid reflux symptoms).    [provider]  MEGARED OMEGA-3 KRILL OIL PO Take 1 capsule by mouth daily with breakfast.    [provider]  metoprolol succinate (TOPROL-XL) 50 MG 24 hr tablet Take 1 tablet (50 mg total) by mouth daily. 03/28/21   Cannon Kettle, PA-C  Multiple Vitamins-Minerals (CENTRUM ADULTS) TABS Take 1 tablet by mouth daily with breakfast.    [provider]  rosuvastatin (CRESTOR) 20 MG tablet Take 1 tablet (20 mg total) by mouth daily. 12/10/22 05/09/23  Lewayne Bunting, MD  VITAMIN E PO Take 1 capsule by mouth daily with breakfast.    [provider]      Allergies    Penicillins, Fluticasone, Gabapentin (once-daily), Iodine, and Clindamycin    Review of Systems   Review of Systems  Cardiovascular:  Positive for chest pain.    Physical Exam Updated Vital Signs BP (!) 150/84   Pulse (!) 58   Temp 97.9 F (36.6 C) (Temporal)   Resp 14   SpO2 97%  Physical Exam  Vitals and nursing note reviewed.  Constitutional:      General: He is not in acute distress.    Appearance: He is well-developed.  HENT:     Head: Normocephalic and atraumatic.  Eyes:     Conjunctiva/sclera: Conjunctivae normal.     Pupils: Pupils are equal, round, and reactive to light.  Cardiovascular:     Rate and Rhythm: Normal rate and regular rhythm.     Heart sounds: No murmur heard. Pulmonary:     Effort: Pulmonary effort is normal. No respiratory distress.     Breath sounds: Normal breath sounds. No wheezing or rales.  Chest:     Chest wall: Tenderness present.  Abdominal:     General: There is no distension.     Palpations: Abdomen is soft.     Tenderness:  There is abdominal tenderness in the left upper quadrant. There is no left CVA tenderness, guarding or rebound.  Musculoskeletal:        General: Tenderness present. Normal range of motion.     Cervical back: Normal range of motion and neck supple.     Right lower leg: Edema present.     Left lower leg: Edema present.     Comments: Mild tenderness and swelling of the distal left wrist.  Trace edema bilateral ankles  Skin:    General: Skin is warm and dry.     Findings: No erythema or rash.  Neurological:     Mental Status: He is alert and oriented to person, place, and time.  Psychiatric:        Behavior: Behavior normal.     ED Results / Procedures / Treatments   Labs (all labs ordered are listed, but only abnormal results are displayed) Labs Reviewed  BASIC METABOLIC PANEL  CBC  TROPONIN I (HIGH SENSITIVITY)  TROPONIN I (HIGH SENSITIVITY)    EKG EKG Interpretation Date/Time:  Sunday May 18 2023 11:14:01 EST Ventricular Rate:  55 PR Interval:  166 QRS Duration:  104 QT Interval:  431 QTC Calculation: 413 R Axis:   -33  Text Interpretation: Sinus rhythm Left axis deviation Normal ECG Confirmed by Gwyneth Sprout ([id number]) on 05/18/2023 11:16:15 AM  Radiology CT CHEST ABDOMEN PELVIS WO CONTRAST  Result Date: 05/18/2023 CLINICAL DATA:  62 year old male history of trauma from a recent fall off of a ladder 11 days ago. Abdominal and leg swelling. EXAM: CT CHEST, ABDOMEN AND PELVIS WITHOUT CONTRAST TECHNIQUE: Multidetector CT imaging of the chest, abdomen and pelvis was performed following the standard protocol without IV contrast. RADIATION DOSE REDUCTION: This exam was performed according to the departmental dose-optimization program which includes automated exposure control, adjustment of the mA and/or kV according to patient size and/or use of iterative reconstruction technique. COMPARISON:  Chest CT 05/09/2023. FINDINGS: CT CHEST FINDINGS Cardiovascular: Heart size is  normal. There is no significant pericardial fluid, thickening or pericardial calcification. No atherosclerotic calcifications are noted in the thoracic aorta or the coronary arteries. Mediastinum/Nodes: No pathologically enlarged mediastinal or hilar lymph nodes. Please note that accurate exclusion of hilar adenopathy is limited on noncontrast CT scans. Esophagus is unremarkable in appearance. No axillary lymphadenopathy. Lungs/Pleura: No pneumothorax. No acute consolidative airspace disease. No pleural effusions. No suspicious appearing pulmonary nodules or masses are noted. Musculoskeletal: There are no aggressive appearing lytic or blastic lesions noted in the visualized portions of the skeleton. CT ABDOMEN PELVIS FINDINGS Hepatobiliary: Diffuse low attenuation throughout the hepatic parenchyma, indicative of a background of hepatic steatosis. No definite suspicious  cystic or solid hepatic lesions are confidently identified on today's noncontrast CT examination. Unenhanced appearance of the gallbladder is unremarkable. Pancreas: No definite pancreatic mass or peripancreatic fluid collections or inflammatory changes are noted on today's noncontrast CT examination. Spleen: Unremarkable. Adrenals/Urinary Tract: Bilateral kidneys and bilateral adrenal glands are normal in appearance. No hydroureteronephrosis. Urinary bladder is unremarkable in appearance. Stomach/Bowel: The unenhanced appearance of the stomach is normal. No pathologic dilatation of small bowel or colon. Numerous colonic diverticula are noted, without surrounding inflammatory changes to suggest an acute diverticulitis at this time. Normal appendix. Vascular/Lymphatic: Atherosclerotic calcifications are noted in the pelvic vasculature. No lymphadenopathy noted in the abdomen or pelvis. Reproductive: Prostate gland and seminal vesicles are unremarkable in appearance. Other: No significant volume of ascites.  No pneumoperitoneum. Musculoskeletal: There  are no aggressive appearing lytic or blastic lesions noted in the visualized portions of the skeleton. IMPRESSION: 1. No evidence of significant acute traumatic injury in the chest, abdomen or pelvis. 2. Specifically, no residual pneumothorax. 3. Colonic diverticulosis without evidence of acute diverticulitis at this time. 4. Hepatic steatosis. 5. Mild atherosclerosis. 6. Additional incidental findings, as above. Electronically Signed   By: Trudie Reed M.D.   On: 05/18/2023 12:29   DG Chest 2 View  Result Date: 05/18/2023 CLINICAL DATA:  Chest pain. EXAM: CHEST - 2 VIEW COMPARISON:  May 09, 2023. FINDINGS: The heart size and mediastinal contours are within normal limits. Both lungs are clear. The visualized skeletal structures are unremarkable. IMPRESSION: No active cardiopulmonary disease. Electronically Signed   By: Lupita Raider M.D.   On: 05/18/2023 11:50    Procedures Procedures    Medications Ordered in ED Medications - No data to display  ED Course/ Medical Decision Making/ A&P                                 Medical Decision Making Amount and/or Complexity of Data Reviewed Labs: ordered. Decision-making details documented in ED Course. Radiology: ordered and independent interpretation performed. Decision-making details documented in ED Course.   Pt with multiple medical problems and comorbidities and presenting today with a complaint that caries a high risk for morbidity and mortality.  Here with persistent pain which could be related to expanding pneumothorax versus developing pneumonia versus intra-abdominal injury due to his fall 11 days ago.  Also possibility that pain is exacerbated because he is discontinued the opiate medication due to running out and his continued only the ibuprofen.  Patient's vital signs are reassuring.  Sats are 97% on room air.  Will ensure no new anemia via labs and do a scan to evaluate for any new or worsening trauma. I independently  interpreted patient's labs and CBC, BMP, troponin are all within normal limits.  I have independently visualized and interpreted pt's images today. Chest x-ray without evidence of pneumothorax, CT of the abdomen and pelvis and chest were negative for acute progressive traumatic injuries.  I independently interpreted patient's EKG which shows no acute findings.  All the findings were discussed with the patient.  Will continue supportive care with pain control.  At this time patient appears stable for discharge home.  He is comfortable with this plan.         Final Clinical Impression(s) / ED Diagnoses Final diagnoses:  Chest wall pain    Rx / DC Orders ED Discharge Orders     None  Gwyneth Sprout, MD 05/18/23 1257

## 2023-05-18 NOTE — Discharge Instructions (Signed)
All the imaging today looked normal and your blood tests were good.  It is most likely a muscle tear that is just going to take time to get better.  Continue to elevate your legs as you need.  You can take 2 Tylenol and 2 ibuprofen together every 6 hours as needed for the pain but you can also try Salonpas or Voltaren gel in the area of pain.

## 2023-05-18 NOTE — ED Triage Notes (Signed)
Pt fell from a ladder 11 days ago, seen in ED at that time and told he had a partially collapsed left lung, d/c'd home in stable condition with Rx for hydrocodone.  Since d/c pt reports pain in left chest and back have persisted and on Friday he noticed marked swelling in his bilateral legs and feet and SOB that has come and gone throughout the week, worsening a bit this weekend.  Pt h/o afib.

## 2023-06-07 ENCOUNTER — Other Ambulatory Visit: Payer: Self-pay

## 2023-07-28 ENCOUNTER — Encounter: Payer: Self-pay | Admitting: Cardiology

## 2023-07-28 DIAGNOSIS — D508 Other iron deficiency anemias: Secondary | ICD-10-CM

## 2023-07-28 DIAGNOSIS — I480 Paroxysmal atrial fibrillation: Secondary | ICD-10-CM

## 2023-07-28 DIAGNOSIS — E7800 Pure hypercholesterolemia, unspecified: Secondary | ICD-10-CM

## 2023-07-28 DIAGNOSIS — Z125 Encounter for screening for malignant neoplasm of prostate: Secondary | ICD-10-CM

## 2023-07-28 DIAGNOSIS — R5383 Other fatigue: Secondary | ICD-10-CM

## 2023-07-29 ENCOUNTER — Other Ambulatory Visit: Payer: Self-pay | Admitting: *Deleted

## 2023-07-29 DIAGNOSIS — I480 Paroxysmal atrial fibrillation: Secondary | ICD-10-CM

## 2023-07-29 DIAGNOSIS — Z125 Encounter for screening for malignant neoplasm of prostate: Secondary | ICD-10-CM

## 2023-07-29 DIAGNOSIS — E7800 Pure hypercholesterolemia, unspecified: Secondary | ICD-10-CM

## 2023-07-29 DIAGNOSIS — R5383 Other fatigue: Secondary | ICD-10-CM

## 2023-07-29 DIAGNOSIS — D508 Other iron deficiency anemias: Secondary | ICD-10-CM

## 2023-08-02 LAB — COMPREHENSIVE METABOLIC PANEL
ALT: 20 [IU]/L (ref 0–44)
AST: 16 [IU]/L (ref 0–40)
Albumin: 4.2 g/dL (ref 3.9–4.9)
Alkaline Phosphatase: 106 [IU]/L (ref 44–121)
BUN/Creatinine Ratio: 16 (ref 10–24)
BUN: 17 mg/dL (ref 8–27)
Bilirubin Total: 0.5 mg/dL (ref 0.0–1.2)
CO2: 24 mmol/L (ref 20–29)
Calcium: 9.7 mg/dL (ref 8.6–10.2)
Chloride: 105 mmol/L (ref 96–106)
Creatinine, Ser: 1.06 mg/dL (ref 0.76–1.27)
Globulin, Total: 2 g/dL (ref 1.5–4.5)
Glucose: 100 mg/dL — ABNORMAL HIGH (ref 70–99)
Potassium: 4.4 mmol/L (ref 3.5–5.2)
Sodium: 142 mmol/L (ref 134–144)
Total Protein: 6.2 g/dL (ref 6.0–8.5)
eGFR: 78 mL/min/{1.73_m2} (ref >59)

## 2023-08-02 LAB — IRON,TIBC AND FERRITIN PANEL
Ferritin: 237 ng/mL (ref 30–400)
Iron Saturation: 23 % (ref 15–55)
Iron: 73 ug/dL (ref 38–169)
Total Iron Binding Capacity: 318 ug/dL (ref 250–450)
UIBC: 245 ug/dL (ref 111–343)

## 2023-08-02 LAB — CBC
Hematocrit: 43.3 % (ref 37.5–51.0)
Hemoglobin: 14.3 g/dL (ref 13.0–17.7)
MCH: 29.1 pg (ref 26.6–33.0)
MCHC: 33 g/dL (ref 31.5–35.7)
MCV: 88 fL (ref 79–97)
Platelets: 232 10*3/uL (ref 150–450)
RBC: 4.91 x10E6/uL (ref 4.14–5.80)
RDW: 13.3 % (ref 11.6–15.4)
WBC: 7.5 10*3/uL (ref 3.4–10.8)

## 2023-08-02 LAB — LIPID PANEL
Chol/HDL Ratio: 3.5 {ratio} (ref 0.0–5.0)
Cholesterol, Total: 215 mg/dL — ABNORMAL HIGH (ref 100–199)
HDL: 61 mg/dL (ref >39)
LDL Chol Calc (NIH): 121 mg/dL — ABNORMAL HIGH (ref 0–99)
Triglycerides: 188 mg/dL — ABNORMAL HIGH (ref 0–149)
VLDL Cholesterol Cal: 33 mg/dL (ref 5–40)

## 2023-08-02 LAB — PSA: Prostate Specific Ag, Serum: 1.6 ng/mL (ref 0.0–4.0)

## 2023-08-04 ENCOUNTER — Other Ambulatory Visit: Payer: Self-pay

## 2023-08-04 DIAGNOSIS — E7800 Pure hypercholesterolemia, unspecified: Secondary | ICD-10-CM

## 2023-08-04 DIAGNOSIS — R7309 Other abnormal glucose: Secondary | ICD-10-CM

## 2023-08-04 MED ORDER — EZETIMIBE 10 MG PO TABS: 10.0000 mg | ORAL_TABLET | Freq: Every day | ORAL | 3 refills | Status: AC

## 2023-10-06 ENCOUNTER — Encounter (INDEPENDENT_AMBULATORY_CARE_PROVIDER_SITE_OTHER): Payer: Self-pay | Admitting: Family Medicine

## 2023-11-21 NOTE — Progress Notes (Deleted)
 HPI: FU paroxysmal atrial fibrillation. Note he had a prolonged vagal episode following an exercise treadmill requiring transient CPR in the past. Echocardiogram September 2021 showed ejection fraction 55 to 60%, mild left ventricular hypertrophy, grade 1 diastolic dysfunction, mild aortic insufficiency and mildly dilated ascending aorta at 40 mm.  Cardiac CTA October 2022 showed calcium  score 0, no significant coronary disease, moderate ascending aorta dilatation at 45 mm; aortic atherosclerosis.  CTA of the thoracic aorta September 2024 showed 4.5 cm ascending thoracic aortic aneurysm, 6 mm right upper lobe pulmonary nodule and follow-up recommended 6 to 12 months. Since I last saw him,   Current Outpatient Medications  Medication Sig Dispense Refill   aspirin 81 MG tablet Take 81 mg by mouth daily.     Cholecalciferol (VITAMIN D3 PO) Take 1 capsule by mouth daily with breakfast.     Coenzyme Q10 (CO Q10 PO) Take 1 capsule by mouth daily with breakfast.     ezetimibe  (ZETIA ) 10 MG tablet Take 1 tablet (10 mg total) by mouth daily. 90 tablet 3   HYDROcodone -acetaminophen  (NORCO/VICODIN) 5-325 MG tablet Take 1 tablet by mouth every 6 (six) hours as needed for severe pain (pain score 7-10). 12 tablet 0   lansoprazole (PREVACID) 15 MG capsule Take 15-30 mg by mouth daily as needed (for acid reflux symptoms).     MEGARED OMEGA-3 KRILL OIL PO Take 1 capsule by mouth daily with breakfast.     metoprolol  succinate (TOPROL -XL) 50 MG 24 hr tablet Take 1 tablet (50 mg total) by mouth daily. 90 tablet 3   Multiple Vitamins-Minerals (CENTRUM ADULTS) TABS Take 1 tablet by mouth daily with breakfast.     rosuvastatin  (CRESTOR ) 20 MG tablet Take 1 tablet (20 mg total) by mouth daily. 90 tablet 3   VITAMIN E PO Take 1 capsule by mouth daily with breakfast.     No current facility-administered medications for this visit.     Past Medical History:  Diagnosis Date   GERD    HYPERLIPIDEMIA    OSA  (obstructive sleep apnea) 01/02/2018   PAROXYSMAL ATRIAL FIBRILLATION    Syncope and collapse     Past Surgical History:  Procedure Laterality Date   TONSILLECTOMY      Social History   Socioeconomic History   Marital status: Single    Spouse name: Not on file   Number of children: Not on file   Years of education: Not on file   Highest education level: Not on file  Occupational History   Not on file  Tobacco Use   Smoking status: Never   Smokeless tobacco: Never  Vaping Use   Vaping status: Never Used  Substance and Sexual Activity   Alcohol use: No   Drug use: No   Sexual activity: Not on file  Other Topics Concern   Not on file  Social History Narrative   Not on file   Social Drivers of Health   Financial Resource Strain: Not on file  Food Insecurity: Not on file  Transportation Needs: Not on file  Physical Activity: Not on file  Stress: Not on file  Social Connections: Not on file  Intimate Partner Violence: Not on file    Family History  Problem Relation Age of Onset   Heart attack Father    Coronary artery disease Other     ROS: no fevers or chills, productive cough, hemoptysis, dysphasia, odynophagia, melena, hematochezia, dysuria, hematuria, rash, seizure activity, orthopnea, PND, pedal edema, claudication.  Remaining systems are negative.  Physical Exam: Well-developed well-nourished in no acute distress.  Skin is warm and dry.  HEENT is normal.  Neck is supple.  Chest is clear to auscultation with normal expansion.  Cardiovascular exam is regular rate and rhythm.  Abdominal exam nontender or distended. No masses palpated. Extremities show no edema. neuro grossly intact  ECG- personally reviewed  A/P  1 thoracic aortic aneurysm-plan follow-up CTA September 2025.  2 pulmonary nodule-this will be evaluated with the above CT.  3 paroxysmal atrial fibrillation-he is in sinus rhythm today.  CHA2DS2-VASc is 0.  Continue Toprol .  4  hyperlipidemia-  5 aortic insufficiency-mild on most recent echocardiogram.  Alexandria Angel, MD

## 2023-12-04 ENCOUNTER — Ambulatory Visit: Payer: Commercial Managed Care - HMO | Admitting: Cardiology

## 2023-12-12 ENCOUNTER — Encounter (INDEPENDENT_AMBULATORY_CARE_PROVIDER_SITE_OTHER): Payer: Self-pay | Admitting: Family Medicine

## 2023-12-23 ENCOUNTER — Encounter (INDEPENDENT_AMBULATORY_CARE_PROVIDER_SITE_OTHER): Payer: 59 | Admitting: Family Medicine

## 2024-01-21 ENCOUNTER — Ambulatory Visit: Attending: Cardiology | Admitting: Cardiology

## 2024-01-21 ENCOUNTER — Encounter: Payer: Self-pay | Admitting: Cardiology

## 2024-01-21 VITALS — BP 122/82 | HR 56 | Ht 71.0 in | Wt 220.0 lb

## 2024-01-21 DIAGNOSIS — I351 Nonrheumatic aortic (valve) insufficiency: Secondary | ICD-10-CM | POA: Diagnosis not present

## 2024-01-21 DIAGNOSIS — E7800 Pure hypercholesterolemia, unspecified: Secondary | ICD-10-CM

## 2024-01-21 DIAGNOSIS — I7121 Aneurysm of the ascending aorta, without rupture: Secondary | ICD-10-CM

## 2024-01-21 DIAGNOSIS — I480 Paroxysmal atrial fibrillation: Secondary | ICD-10-CM

## 2024-01-21 NOTE — Patient Instructions (Signed)
   Lab Work:  Your physician recommends that you return for lab work 2 WEEKS PRIOR TO CT SCAN  If you have labs (blood work) drawn today and your tests are completely normal, you will receive your results only by: MyChart Message (if you have MyChart) OR A paper copy in the mail If you have any lab test that is abnormal or we need to change your treatment, we will call you to review the results.  Testing/Procedures:  CTA OF THE CHEST AT [facility]-SCHEDULE IN SEPTEMBER  Follow-Up: At [facility], you and your health needs are our priority.  As part of our continuing mission to provide you with exceptional heart care, our providers are all part of one team.  This team includes your primary Cardiologist (physician) and Advanced Practice Providers or APPs (Physician Assistants and Nurse Practitioners) who all work together to provide you with the care you need, when you need it.  Your next appointment:   12 month(s)  Provider:   Redell Shallow, MD

## 2024-01-21 NOTE — Progress Notes (Signed)
 HPI: FU paroxysmal atrial fibrillation. Note he had a prolonged vagal episode following an exercise treadmill requiring transient CPR in the past. Echocardiogram September 2021 showed ejection fraction 55 to 60%, mild left ventricular hypertrophy, grade 1 diastolic dysfunction, mild aortic insufficiency and mildly dilated ascending aorta at 40 mm.  Cardiac CTA October 2022 showed calcium  score 0, no significant coronary disease, moderate ascending aorta dilatation at 45 mm; aortic atherosclerosis.  CTA of the thoracic aorta September 2024 showed 4.5 cm ascending thoracic aortic aneurysm, 6 mm right upper lobe pulmonary nodule and follow-up recommended 6 to 12 months. Since I last saw him, the patient has dyspnea with more extreme activities but not with routine activities. It is relieved with rest. It is not associated with chest pain. There is no orthopnea, PND or pedal edema. There is no syncope or palpitations. There is no exertional chest pain.   Current Outpatient Medications  Medication Sig Dispense Refill   aspirin 81 MG tablet Take 81 mg by mouth daily.     Cholecalciferol (VITAMIN D3 PO) Take 1 capsule by mouth daily with breakfast.     Coenzyme Q10 (CO Q10 PO) Take 1 capsule by mouth daily with breakfast.     HYDROcodone -acetaminophen  (NORCO/VICODIN) 5-325 MG tablet Take 1 tablet by mouth every 6 (six) hours as needed for severe pain (pain score 7-10). 12 tablet 0   lansoprazole (PREVACID) 15 MG capsule Take 15-30 mg by mouth daily as needed (for acid reflux symptoms).     MEGARED OMEGA-3 KRILL OIL PO Take 1 capsule by mouth daily with breakfast.     metoprolol  succinate (TOPROL -XL) 50 MG 24 hr tablet Take 1 tablet (50 mg total) by mouth daily. 90 tablet 3   Multiple Vitamins-Minerals (CENTRUM ADULTS) TABS Take 1 tablet by mouth daily with breakfast.     rosuvastatin  (CRESTOR ) 20 MG tablet Take 1 tablet (20 mg total) by mouth daily. 90 tablet 3   VITAMIN E PO Take 1 capsule by mouth  daily with breakfast.     ezetimibe  (ZETIA ) 10 MG tablet Take 1 tablet (10 mg total) by mouth daily. (Patient not taking: Reported on 01/21/2024) 90 tablet 3   No current facility-administered medications for this visit.     Past Medical History:  Diagnosis Date   GERD    HYPERLIPIDEMIA    OSA (obstructive sleep apnea) 01/02/2018   PAROXYSMAL ATRIAL FIBRILLATION    Syncope and collapse     Past Surgical History:  Procedure Laterality Date   TONSILLECTOMY      Social History   Socioeconomic History   Marital status: Single    Spouse name: Not on file   Number of children: Not on file   Years of education: Not on file   Highest education level: Not on file  Occupational History   Not on file  Tobacco Use   Smoking status: Never   Smokeless tobacco: Never  Vaping Use   Vaping status: Never Used  Substance and Sexual Activity   Alcohol use: No   Drug use: No   Sexual activity: Not on file  Other Topics Concern   Not on file  Social History Narrative   Not on file   Social Drivers of Health   Financial Resource Strain: Not on file  Food Insecurity: Not on file  Transportation Needs: Not on file  Physical Activity: Not on file  Stress: Not on file  Social Connections: Not on file  Intimate Partner Violence: Not  on file    Family History  Problem Relation Age of Onset   Heart attack Father    Coronary artery disease Other     ROS: no fevers or chills, productive cough, hemoptysis, dysphasia, odynophagia, melena, hematochezia, dysuria, hematuria, rash, seizure activity, orthopnea, PND, pedal edema, claudication. Remaining systems are negative.  Physical Exam: Well-developed well-nourished in no acute distress.  Skin is warm and dry.  HEENT is normal.  Neck is supple.  Chest is clear to auscultation with normal expansion.  Cardiovascular exam is regular rate and rhythm.  Abdominal exam nontender or distended. No masses palpated. Extremities show no  edema. neuro grossly intact  EKG Interpretation Date/Time:  Wednesday January 21 2024 13:19:24 EDT Ventricular Rate:  56 PR Interval:  172 QRS Duration:  110 QT Interval:  432 QTC Calculation: 416 R Axis:   -37  Text Interpretation: Sinus bradycardia Left axis deviation Minimal voltage criteria for LVH, may be normal variant ( Cornell product ) Confirmed by Pietro Rogue ([id number]) on 01/21/2024 1:20:44 PM    A/P  1 paroxysmal atrial fibrillation-patient remains in sinus rhythm.  Continue Toprol .  Not on anticoagulation given CHA2DS2-VASc of 0.  2 thoracic aortic aneurysm-he will need follow-up CTA September 2025.  3 history of aortic insufficiency-mild on most recent echocardiogram.  4 hyperlipidemia-continue Lipitor.  Note he had elevated liver functions on higher doses in the past. Continue Zetia .  Check lipids and liver.  5 lung nodule-plan follow-up CT September 2025.  Rogue Pietro, MD

## 2024-02-20 ENCOUNTER — Ambulatory Visit
Admission: EM | Admit: 2024-02-20 | Discharge: 2024-02-20 | Disposition: A | Discharge status: Home / Self Care | Attending: Family Medicine | Admitting: Family Medicine

## 2024-02-20 ENCOUNTER — Ambulatory Visit

## 2024-02-20 ENCOUNTER — Encounter: Payer: Self-pay | Admitting: Emergency Medicine

## 2024-02-20 DIAGNOSIS — M79644 Pain in right finger(s): Secondary | ICD-10-CM

## 2024-02-20 DIAGNOSIS — M7989 Other specified soft tissue disorders: Secondary | ICD-10-CM

## 2024-02-20 MED ORDER — CLINDAMYCIN HCL 300 MG PO CAPS: 300.0000 mg | ORAL_CAPSULE | Freq: Three times a day (TID) | ORAL | 0 refills | Status: AC

## 2024-02-20 NOTE — ED Triage Notes (Signed)
 Pt presents c/o right hand injury x 4 days. Pt reports his hand has been swollen since Monday. It started just the tip of 2nd finger but now has spread down to his knuckle. Pt also reports swelling in pinky finger as well. Pt states he can barely bend the fingers. Pt denies injury or bug bite but says it is a possibility with his line of work.

## 2024-02-20 NOTE — ED Provider Notes (Signed)
 [facility]    CSN: [id number] Arrival date & time: 02/20/24  9167      History   Chief Complaint Chief Complaint  Patient presents with   Hand Injury    Right hand     HPI ERMIAS TOMEO is a 63 y.o. male.    Hand Injury  Patient is here for right hand swelling.  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 Sunday evening he noted swelling and a thorn to the pad of the right index finger. He removed it.  The next morning he noted swelling to the finger and pain to the joints.  It has continued to swell and caused pain.  This morning it has spread down the finger into the hand.   No fevers/chills.  He did take tylenol  this morning.        Past Medical History:  Diagnosis Date   GERD    HYPERLIPIDEMIA    OSA (obstructive sleep apnea) 01/02/2018   PAROXYSMAL ATRIAL FIBRILLATION    Syncope and collapse     Patient Active Problem List   Diagnosis Date Noted   OSA (obstructive sleep apnea) 01/02/2018   HYPERLIPIDEMIA 12/22/2008   PAROXYSMAL ATRIAL FIBRILLATION 12/22/2008   GERD 12/22/2008   HYPERGLYCEMIA 12/22/2008   SYNCOPE AND COLLAPSE 12/21/2008    Past Surgical History:  Procedure Laterality Date   TONSILLECTOMY         Home Medications    Prior to Admission medications   Medication Sig Start Date End Date Taking? Authorizing Provider  aspirin 81 MG tablet Take 81 mg by mouth daily.    [provider]  Cholecalciferol (VITAMIN D3 PO) Take 1 capsule by mouth daily with breakfast.    [provider]  Coenzyme Q10 (CO Q10 PO) Take 1 capsule by mouth daily with breakfast.    [provider]  ezetimibe  (ZETIA ) 10 MG tablet Take 1 tablet (10 mg total) by mouth daily. Patient not taking: Reported on 01/21/2024 08/04/23 11/02/23  Pietro Redell RAMAN, MD  HYDROcodone -acetaminophen  (NORCO/VICODIN) 5-325 MG tablet Take 1 tablet by mouth every 6 (six) hours as needed for severe pain (pain score 7-10).  05/09/23   Barrett, Jamie N, PA-C  lansoprazole (PREVACID) 15 MG capsule Take 15-30 mg by mouth daily as needed (for acid reflux symptoms).    [provider]  MEGARED OMEGA-3 KRILL OIL PO Take 1 capsule by mouth daily with breakfast.    [provider]  metoprolol  succinate (TOPROL -XL) 50 MG 24 hr tablet Take 1 tablet (50 mg total) by mouth daily. 03/28/21   Cindie Delon POUR, PA-C  Multiple Vitamins-Minerals (CENTRUM ADULTS) TABS Take 1 tablet by mouth daily with breakfast.    [provider]  rosuvastatin  (CRESTOR ) 20 MG tablet Take 1 tablet (20 mg total) by mouth daily. 12/10/22 01/21/24  Pietro Redell RAMAN, MD  VITAMIN E PO Take 1 capsule by mouth daily with breakfast.    [provider]    Family History Family History  Problem Relation Age of Onset   Heart attack Father    Coronary artery disease Other     Social History Social History   Tobacco Use   Smoking status: Never    Passive exposure: Never   Smokeless tobacco: Never  Vaping Use   Vaping status: Never Used  Substance Use Topics   Alcohol use: No   Drug use: No     Allergies   Penicillins, Fluticasone, Gabapentin,  Gabapentin (once-daily), Iodine, and Clindamycin    Review of Systems Review of Systems  Constitutional: Negative.   HENT: Negative.    Respiratory: Negative.    Cardiovascular: Negative.   Gastrointestinal: Negative.   Genitourinary: Negative.   Musculoskeletal:  Positive for joint swelling.     Physical Exam Triage Vital Signs ED Triage Vitals  Encounter Vitals Group     BP 02/20/24 0930 131/78     Girls Systolic BP Percentile --      Girls Diastolic BP Percentile --      Boys Systolic BP Percentile --      Boys Diastolic BP Percentile --      Pulse Rate 02/20/24 0930 74     Resp 02/20/24 0930 18     Temp 02/20/24 0930 99.1 F (37.3 C)     Temp Source 02/20/24 0930 Oral     SpO2 02/20/24 0930 98 %     Weight 02/20/24 0929 220 lb 0.3 oz (99.8 kg)      Height --      Head Circumference --      Peak Flow --      Pain Score 02/20/24 0928 4     Pain Loc --      Pain Education --      Exclude from Growth Chart --    No data found.  Updated Vital Signs BP 131/78 (BP Location: Left Arm)   Pulse 74   Temp 99.1 F (37.3 C) (Oral)   Resp 18   Wt 99.8 kg   SpO2 98%   BMI 30.69 kg/m   Visual Acuity Right Eye Distance:   Left Eye Distance:   Bilateral Distance:    Right Eye Near:   Left Eye Near:    Bilateral Near:     Physical Exam Constitutional:      Appearance: Normal appearance. He is normal weight.  Musculoskeletal:     Comments: Swelling to the right index finger from the tip to the MCP joint;  there is tenderness throughout;  slight warmth to the pad of the finger.  There is a scab to the pad of the index finger where he dug out a thorn;  unable to flex the finger due to swelling  Another area of tenderness, warm to the medial aspect of the 5th finger;    Neurological:     General: No focal deficit present.     Mental Status: He is alert.  Psychiatric:        Mood and Affect: Mood normal.      UC Treatments / Results  Labs (all labs ordered are listed, but only abnormal results are displayed) Labs Reviewed - No data to display  EKG   Radiology DG Hand Complete Right Result Date: 02/20/2024 CLINICAL DATA:  Progressive right index finger swelling for the past 4 days with some swelling now in the little finger. No known injury or bug bite. EXAM: RIGHT HAND - COMPLETE 3+ VIEW COMPARISON:  Right thumb dated 11/20/2022. FINDINGS: Stable calcific foreign bodies or bone fragments at the location of the previously demonstrated laceration in the ventral aspect of the thumb. Mild diffuse soft tissue swelling involving the index finger. No bone destruction, periosteal reaction or soft tissue gas. No fracture or dislocation. IMPRESSION: 1. Mild diffuse soft tissue swelling involving the index finger. 2. No radiographic  evidence of osteomyelitis. 3. Stable calcific foreign bodies or bone fragments at the location of the previously demonstrated laceration in the ventral aspect of  the thumb. Electronically Signed   By: Elspeth Bathe M.D.   On: 02/20/2024 10:32    Procedures Procedures (including critical care time)  Medications Ordered in UC Medications - No data to display  Initial Impression / Assessment and Plan / UC Course  I have reviewed the triage vital signs and the nursing notes.  Pertinent labs & imaging results that were available during my care of the patient were reviewed by me and considered in my medical decision making (see chart for details).   Final Clinical Impressions(s) / UC Diagnoses   Final diagnoses:  Finger swelling  Hand swelling     Discharge Instructions      You were seen today for hand/finger pain and swelling.  Your xray does not show any fracture, or any foreign body noted.  I am worried about infection of the finger.  I have sent out an antibiotic today.  Please use motrin for pain and swelling.  Please take the medication with food to avoid nausea and upset stomach.  This is the best antibiotic for the type of infection I am treating.  However, if you have worsening pain and swelling despite medication, then please go to the ER for further evaluation and treatment.     ED Prescriptions     Medication Sig Dispense Auth. Provider   clindamycin  (CLEOCIN ) 300 MG capsule Take 1 capsule (300 mg total) by mouth 3 (three) times daily for 7 days. 21 capsule Darral Longs, MD      PDMP not reviewed this encounter.   Darral Longs, MD 02/20/24 1044

## 2024-02-20 NOTE — Discharge Instructions (Addendum)
 You were seen today for hand/finger pain and swelling.  Your xray does not show any fracture, or any foreign body noted.  I am worried about infection of the finger.  I have sent out an antibiotic today.  Please use motrin for pain and swelling.  Please take the medication with food to avoid nausea and upset stomach.  This is the best antibiotic for the type of infection I am treating.  However, if you have worsening pain and swelling despite medication, then please go to the ER for further evaluation and treatment.

## 2024-03-10 ENCOUNTER — Ambulatory Visit: Admitting: Cardiology

## 2024-03-11 ENCOUNTER — Ambulatory Visit (HOSPITAL_COMMUNITY)

## 2024-04-12 ENCOUNTER — Ambulatory Visit (HOSPITAL_COMMUNITY)

## 2024-04-14 ENCOUNTER — Encounter: Payer: Self-pay | Admitting: *Deleted

## 2024-04-14 ENCOUNTER — Other Ambulatory Visit: Payer: Self-pay

## 2024-04-14 ENCOUNTER — Ambulatory Visit
Admission: EM | Admit: 2024-04-14 | Discharge: 2024-04-14 | Disposition: A | Discharge status: Home / Self Care | Attending: Family Medicine | Admitting: Family Medicine

## 2024-04-14 DIAGNOSIS — R21 Rash and other nonspecific skin eruption: Secondary | ICD-10-CM

## 2024-04-14 MED ORDER — TRIAMCINOLONE ACETONIDE 0.1 % EX CREA: 1.0000 | TOPICAL_CREAM | Freq: Two times a day (BID) | CUTANEOUS | 0 refills | Status: AC

## 2024-04-14 NOTE — ED Provider Notes (Addendum)
 [facility]    CSN: [id number] Arrival date & time: 04/14/24  0806      History   Chief Complaint Chief Complaint  Patient presents with   Rash    HPI Colton Moore is a 63 y.o. male.    Rash  Rash initially appeared 2 months ago, working outside, thought it was ant bites.  Had blistering, which resolved overtime. Had it on both arms, and neck line.  Had a friend at his home using different detergent, and when he switched detergents this went away.  Came back from [place] (Thursday last week), worked Friday, new rash on Right arm Saturday.   No new medications, no known insect bite no other exposures.  No fevers, chills, shortness of breath, nausea, vomiting, diarrhea rash in other places.  No mucocutaneous involvement.   Past Medical History:  Diagnosis Date   GERD    HYPERLIPIDEMIA    OSA (obstructive sleep apnea) 01/02/2018   PAROXYSMAL ATRIAL FIBRILLATION    Syncope and collapse     Patient Active Problem List   Diagnosis Date Noted   OSA (obstructive sleep apnea) 01/02/2018   HYPERLIPIDEMIA 12/22/2008   PAROXYSMAL ATRIAL FIBRILLATION 12/22/2008   GERD 12/22/2008   HYPERGLYCEMIA 12/22/2008   SYNCOPE AND COLLAPSE 12/21/2008    Past Surgical History:  Procedure Laterality Date   TONSILLECTOMY        Home Medications    Prior to Admission medications   Medication Sig Start Date End Date Taking? Authorizing Provider  aspirin 81 MG tablet Take 81 mg by mouth daily.   Yes [provider]  Cholecalciferol (VITAMIN D3 PO) Take 1 capsule by mouth daily with breakfast.   Yes [provider]  Coenzyme Q10 (CO Q10 PO) Take 1 capsule by mouth daily with breakfast.   Yes [provider]  lansoprazole (PREVACID) 15 MG capsule Take 15-30 mg by mouth daily as needed (for acid reflux symptoms).   Yes [provider]  MEGARED OMEGA-3 KRILL OIL PO Take 1 capsule by mouth daily with breakfast.   Yes [provider]   metoprolol  succinate (TOPROL -XL) 50 MG 24 hr tablet Take 1 tablet (50 mg total) by mouth daily. 03/28/21  Yes Cindie Delon POUR, PA-C  Multiple Vitamins-Minerals (CENTRUM ADULTS) TABS Take 1 tablet by mouth daily with breakfast.   Yes [provider]  rosuvastatin  (CRESTOR ) 20 MG tablet Take 1 tablet (20 mg total) by mouth daily. 12/10/22 04/14/24 Yes Pietro Redell RAMAN, MD  triamcinolone cream (KENALOG) 0.1 % Apply 1 Application topically 2 (two) times daily. For 7-14 days until rash resolved. 04/14/24  Yes Howell Lunger, DO  VITAMIN E PO Take 1 capsule by mouth daily with breakfast.   Yes [provider]  ezetimibe  (ZETIA ) 10 MG tablet Take 1 tablet (10 mg total) by mouth daily. Patient not taking: Reported on 01/21/2024 08/04/23 11/02/23  Pietro Redell RAMAN, MD  HYDROcodone -acetaminophen  (NORCO/VICODIN) 5-325 MG tablet Take 1 tablet by mouth every 6 (six) hours as needed for severe pain (pain score 7-10). Patient not taking: Reported on 04/14/2024 05/09/23   Barrett, Jamie N, PA-C  tobramycin-dexamethasone (TOBRADEX) ophthalmic solution PLACE 1 DROP INTO AFFECTED EYE(S) FOUR TIMES DAILY Patient not taking: Reported on 04/14/2024    [provider]    Family History Family History  Problem Relation Age of Onset   Heart attack Father    Coronary artery disease Other     Social History Social History   Tobacco Use  Smoking status: Never    Passive exposure: Never   Smokeless tobacco: Never  Vaping Use   Vaping status: Never Used  Substance Use Topics   Alcohol use: No   Drug use: No     Allergies   Penicillins, Fluticasone, Gabapentin, Gabapentin (once-daily), Iodine, and Clindamycin    Review of Systems Review of Systems  Skin:  Positive for rash.     Physical Exam Triage Vital Signs ED Triage Vitals  Encounter Vitals Group     BP 04/14/24 0824 129/81     Girls Systolic BP Percentile --      Girls Diastolic BP Percentile --      Boys  Systolic BP Percentile --      Boys Diastolic BP Percentile --      Pulse Rate 04/14/24 0824 (!) 57     Resp 04/14/24 0824 16     Temp 04/14/24 0824 97.8 F (36.6 C)     Temp Source 04/14/24 0824 Oral     SpO2 04/14/24 0824 95 %     Weight --      Height --      Head Circumference --      Peak Flow --      Pain Score 04/14/24 0821 2     Pain Loc --      Pain Education --      Exclude from Growth Chart --    No data found.  Updated Vital Signs BP 129/81 (BP Location: Right Arm)   Pulse (!) 57   Temp 97.8 F (36.6 C) (Oral)   Resp 16   SpO2 95%   Visual Acuity Right Eye Distance:   Left Eye Distance:   Bilateral Distance:    Right Eye Near:   Left Eye Near:    Bilateral Near:     Physical Exam HENT:     Head: Normocephalic and atraumatic.     Mouth/Throat:     Mouth: Mucous membranes are moist.     Pharynx: Oropharynx is clear. No oropharyngeal exudate or posterior oropharyngeal erythema.  Eyes:     Extraocular Movements: Extraocular movements intact.     Conjunctiva/sclera: Conjunctivae normal.     Pupils: Pupils are equal, round, and reactive to light.  Pulmonary:     Effort: Pulmonary effort is normal.  Musculoskeletal:     Cervical back: Normal range of motion.  Skin:    Findings: Rash present.     Comments: Localized to left upper extremity, 4 ruptured blisters with erythematous base and indurated skin, no drainage/warmth.  No spreading erythema up to extremity.  No mucocutaneous involvement.      UC Treatments / Results  Labs (all labs ordered are listed, but only abnormal results are displayed) Labs Reviewed - No data to display  EKG   Radiology No results found.  Procedures Procedures (including critical care time)  Medications Ordered in UC Medications - No data to display  Initial Impression / Assessment and Plan / UC Course  I have reviewed the triage vital signs and the nursing notes.  Pertinent labs & imaging results that were  available during my care of the patient were reviewed by me and considered in my medical decision making (see chart for details).     Afebrile, well-appearing 63 year old male with mildly pruritic nonspecific skin eruption, blisters which are now ruptured. Localized to left upper extremity (note photo in chart incorrect labeled as right arm, is in fact left arm).  No red flag symptoms  to suggest systemic disease.  Suspect allergic reaction.  Trial triamcinolone cream twice daily for 7 to 14 days.   Final Clinical Impressions(s) / UC Diagnoses   Final diagnoses:  Rash and nonspecific skin eruption     Discharge Instructions      -Use Triamcinolone cream twice daily for 7-14 days     ED Prescriptions     Medication Sig Dispense Auth. Provider   triamcinolone cream (KENALOG) 0.1 % Apply 1 Application topically 2 (two) times daily. For 7-14 days until rash resolved. 30 g Howell Lunger, DO      PDMP not reviewed this encounter.   Howell Lunger, DO 04/14/24 [id number]    Howell Lunger, DO 04/14/24 [phone]

## 2024-04-14 NOTE — ED Triage Notes (Signed)
 Pt reports allergies for years. States 2 months ago he had a reaction on both arms that he attributed it to ant bites. States the rash was blistered but resolved. States the rash came back on Saturday, just on his left arm. He thinks it may laundry detergent. He states he wants to make sure it's not shingles. He has been taking benadryl and using cortisone cream.

## 2024-04-14 NOTE — Discharge Instructions (Signed)
-  Use Triamcinolone cream twice daily for 7-14 days

## 2024-05-24 ENCOUNTER — Telehealth: Payer: Self-pay | Admitting: Cardiology

## 2024-05-24 MED ORDER — ROSUVASTATIN CALCIUM 20 MG PO TABS: 20.0000 mg | ORAL_TABLET | Freq: Every day | ORAL | 0 refills | Status: AC

## 2024-05-24 NOTE — Telephone Encounter (Signed)
 New message     *STAT* If patient is at the pharmacy, call can be transferred to refill team.   1. Which medications need to be refilled? (please list name of each medication and dose if known) crestor   20mg    2. Would you like to learn more about the convenience, safety, & potential cost savings by using the [facility]?  Not at this time     3. Are you open to using the [facility] (Type [facility].  ).  Not at this time   4. Which pharmacy/location (including street and city if local pharmacy) is medication to be sent to?  [facility] in [place], [place]   (we have called presc in before to this location)   5. Do they need a 30 day or 90 day supply? 60day supply-----------pt will have CT in feb when his medicare ins is valid and pt is due to see dr pietro in aug 2026.    (Pt has approx 4 days of medication left)

## 2024-05-24 NOTE — Telephone Encounter (Signed)
 Refill sent
